# Patient Record
Sex: Female | Born: 1960 | Hispanic: No | Marital: Married | Smoking: Never smoker
Health system: Southern US, Community
[De-identification: ages and names within clinical notes are randomized; demographics above are authoritative.]

## PROBLEM LIST (undated history)

## (undated) DIAGNOSIS — I10 Essential (primary) hypertension: Secondary | ICD-10-CM

---

## 2017-07-21 ENCOUNTER — Emergency Department (HOSPITAL_COMMUNITY): Payer: 59

## 2017-07-21 ENCOUNTER — Other Ambulatory Visit: Payer: Self-pay

## 2017-07-21 ENCOUNTER — Ambulatory Visit (HOSPITAL_COMMUNITY)
Admission: EM | Admit: 2017-07-21 | Discharge: 2017-07-21 | Disposition: A | Discharge status: Other Acute Inpatient Hospital | Payer: Self-pay

## 2017-07-21 ENCOUNTER — Emergency Department (HOSPITAL_BASED_OUTPATIENT_CLINIC_OR_DEPARTMENT_OTHER): Payer: 59

## 2017-07-21 ENCOUNTER — Inpatient Hospital Stay (HOSPITAL_COMMUNITY)
Admission: EM | Admit: 2017-07-21 | Discharge: 2017-07-28 | DRG: 871 | Disposition: A | Discharge status: Home / Self Care | Payer: 59 | Attending: Internal Medicine | Admitting: Internal Medicine

## 2017-07-21 ENCOUNTER — Encounter (HOSPITAL_COMMUNITY): Payer: Self-pay | Admitting: *Deleted

## 2017-07-21 ENCOUNTER — Inpatient Hospital Stay (HOSPITAL_COMMUNITY): Payer: 59

## 2017-07-21 DIAGNOSIS — L03126 Acute lymphangitis of left lower limb: Secondary | ICD-10-CM | POA: Diagnosis present

## 2017-07-21 DIAGNOSIS — I10 Essential (primary) hypertension: Secondary | ICD-10-CM

## 2017-07-21 DIAGNOSIS — L03116 Cellulitis of left lower limb: Secondary | ICD-10-CM | POA: Diagnosis present

## 2017-07-21 DIAGNOSIS — E785 Hyperlipidemia, unspecified: Secondary | ICD-10-CM | POA: Diagnosis present

## 2017-07-21 DIAGNOSIS — L270 Generalized skin eruption due to drugs and medicaments taken internally: Secondary | ICD-10-CM | POA: Diagnosis not present

## 2017-07-21 DIAGNOSIS — B999 Unspecified infectious disease: Secondary | ICD-10-CM

## 2017-07-21 DIAGNOSIS — D649 Anemia, unspecified: Secondary | ICD-10-CM | POA: Diagnosis present

## 2017-07-21 DIAGNOSIS — Z880 Allergy status to penicillin: Secondary | ICD-10-CM | POA: Diagnosis not present

## 2017-07-21 DIAGNOSIS — G9341 Metabolic encephalopathy: Secondary | ICD-10-CM | POA: Diagnosis present

## 2017-07-21 DIAGNOSIS — E875 Hyperkalemia: Secondary | ICD-10-CM | POA: Diagnosis not present

## 2017-07-21 DIAGNOSIS — E871 Hypo-osmolality and hyponatremia: Secondary | ICD-10-CM | POA: Diagnosis present

## 2017-07-21 DIAGNOSIS — Z79899 Other long term (current) drug therapy: Secondary | ICD-10-CM

## 2017-07-21 DIAGNOSIS — M7989 Other specified soft tissue disorders: Secondary | ICD-10-CM

## 2017-07-21 DIAGNOSIS — A419 Sepsis, unspecified organism: Principal | ICD-10-CM

## 2017-07-21 DIAGNOSIS — E872 Acidosis: Secondary | ICD-10-CM | POA: Diagnosis present

## 2017-07-21 DIAGNOSIS — L299 Pruritus, unspecified: Secondary | ICD-10-CM | POA: Diagnosis present

## 2017-07-21 DIAGNOSIS — I872 Venous insufficiency (chronic) (peripheral): Secondary | ICD-10-CM | POA: Diagnosis present

## 2017-07-21 DIAGNOSIS — Z6841 Body Mass Index (BMI) 40.0 and over, adult: Secondary | ICD-10-CM

## 2017-07-21 DIAGNOSIS — G4725 Circadian rhythm sleep disorder, jet lag type: Secondary | ICD-10-CM | POA: Diagnosis present

## 2017-07-21 DIAGNOSIS — N179 Acute kidney failure, unspecified: Secondary | ICD-10-CM | POA: Diagnosis present

## 2017-07-21 DIAGNOSIS — Z91018 Allergy to other foods: Secondary | ICD-10-CM | POA: Diagnosis not present

## 2017-07-21 DIAGNOSIS — T360X5A Adverse effect of penicillins, initial encounter: Secondary | ICD-10-CM | POA: Diagnosis not present

## 2017-07-21 DIAGNOSIS — L0390 Cellulitis, unspecified: Secondary | ICD-10-CM | POA: Diagnosis present

## 2017-07-21 HISTORY — DX: Essential (primary) hypertension: I10

## 2017-07-21 LAB — COMPREHENSIVE METABOLIC PANEL
ALBUMIN: 2.4 g/dL — AB (ref 3.5–5.0)
ALT: 25 U/L (ref 14–54)
AST: 21 U/L (ref 15–41)
Alkaline Phosphatase: 154 U/L — ABNORMAL HIGH (ref 38–126)
Anion gap: 16 — ABNORMAL HIGH (ref 5–15)
BILIRUBIN TOTAL: 1.1 mg/dL (ref 0.3–1.2)
BUN: 82 mg/dL — AB (ref 6–20)
CHLORIDE: 95 mmol/L — AB (ref 101–111)
CO2: 13 mmol/L — AB (ref 22–32)
Calcium: 8.5 mg/dL — ABNORMAL LOW (ref 8.9–10.3)
Creatinine, Ser: 9.42 mg/dL — ABNORMAL HIGH (ref 0.44–1.00)
GFR calc Af Amer: 5 mL/min — ABNORMAL LOW (ref >60)
GFR calc non Af Amer: 4 mL/min — ABNORMAL LOW (ref >60)
GLUCOSE: 116 mg/dL — AB (ref 65–99)
POTASSIUM: 5 mmol/L (ref 3.5–5.1)
SODIUM: 124 mmol/L — AB (ref 135–145)
TOTAL PROTEIN: 7.4 g/dL (ref 6.5–8.1)

## 2017-07-21 LAB — CBC WITH DIFFERENTIAL/PLATELET
Basophils Absolute: 0.2 10*3/uL — ABNORMAL HIGH (ref 0.0–0.1)
Basophils Relative: 1 %
Eosinophils Absolute: 0.4 10*3/uL (ref 0.0–0.7)
Eosinophils Relative: 2 %
HCT: 32.9 % — ABNORMAL LOW (ref 36.0–46.0)
Hemoglobin: 10.5 g/dL — ABNORMAL LOW (ref 12.0–15.0)
Lymphocytes Relative: 8 %
Lymphs Abs: 1.8 10*3/uL (ref 0.7–4.0)
MCH: 26.9 pg (ref 26.0–34.0)
MCHC: 31.9 g/dL (ref 30.0–36.0)
MCV: 84.4 fL (ref 78.0–100.0)
Monocytes Absolute: 0.9 10*3/uL (ref 0.1–1.0)
Monocytes Relative: 4 %
Neutro Abs: 18.9 10*3/uL — ABNORMAL HIGH (ref 1.7–7.7)
Neutrophils Relative %: 85 %
Platelets: 328 10*3/uL (ref 150–400)
RBC: 3.9 MIL/uL (ref 3.87–5.11)
RDW: 16.1 % — ABNORMAL HIGH (ref 11.5–15.5)
WBC Morphology: INCREASED
WBC: 22.2 10*3/uL — ABNORMAL HIGH (ref 4.0–10.5)

## 2017-07-21 LAB — BASIC METABOLIC PANEL
Anion gap: 15 (ref 5–15)
BUN: 83 mg/dL — ABNORMAL HIGH (ref 6–20)
CO2: 15 mmol/L — ABNORMAL LOW (ref 22–32)
Calcium: 8.6 mg/dL — ABNORMAL LOW (ref 8.9–10.3)
Chloride: 94 mmol/L — ABNORMAL LOW (ref 101–111)
Creatinine, Ser: 9.67 mg/dL — ABNORMAL HIGH (ref 0.44–1.00)
GFR calc Af Amer: 5 mL/min — ABNORMAL LOW (ref >60)
GFR calc non Af Amer: 4 mL/min — ABNORMAL LOW (ref >60)
Glucose, Bld: 136 mg/dL — ABNORMAL HIGH (ref 65–99)
Potassium: 5.1 mmol/L (ref 3.5–5.1)
Sodium: 124 mmol/L — ABNORMAL LOW (ref 135–145)

## 2017-07-21 LAB — PROTIME-INR
INR: 1.14
Prothrombin Time: 14.5 seconds (ref 11.4–15.2)

## 2017-07-21 LAB — CK: CK TOTAL: 48 U/L (ref 38–234)

## 2017-07-21 LAB — I-STAT CG4 LACTIC ACID, ED: Lactic Acid, Venous: 1.1 mmol/L (ref 0.5–1.9)

## 2017-07-21 MED ORDER — ONDANSETRON HCL 4 MG/2ML IJ SOLN: 4.0000 mg | Freq: Four times a day (QID) | INTRAMUSCULAR | Status: DC | PRN

## 2017-07-21 MED ORDER — PIPERACILLIN-TAZOBACTAM 3.375 G IVPB 30 MIN
3.3750 g | Freq: Once | INTRAVENOUS | Status: AC
  Administered 2017-07-21: 3.375 g via INTRAVENOUS
  Filled 2017-07-21: qty 50

## 2017-07-21 MED ORDER — LACTATED RINGERS IV BOLUS
1000.0000 mL | Freq: Once | INTRAVENOUS | Status: AC
  Administered 2017-07-21: 1000 mL via INTRAVENOUS

## 2017-07-21 MED ORDER — PIPERACILLIN-TAZOBACTAM IN DEX 2-0.25 GM/50ML IV SOLN
2.2500 g | Freq: Three times a day (TID) | INTRAVENOUS | Status: DC
  Administered 2017-07-22 – 2017-07-24 (×7): 2.25 g via INTRAVENOUS
  Filled 2017-07-21 (×9): qty 50

## 2017-07-21 MED ORDER — ACETAMINOPHEN 650 MG RE SUPP: 650.0000 mg | Freq: Four times a day (QID) | RECTAL | Status: DC | PRN

## 2017-07-21 MED ORDER — VANCOMYCIN HCL IN DEXTROSE 1-5 GM/200ML-% IV SOLN: 1000.0000 mg | Freq: Once | INTRAVENOUS | Status: DC

## 2017-07-21 MED ORDER — HEPARIN SODIUM (PORCINE) 5000 UNIT/ML IJ SOLN
5000.0000 [IU] | Freq: Three times a day (TID) | INTRAMUSCULAR | Status: DC
  Administered 2017-07-22 – 2017-07-28 (×19): 5000 [IU] via SUBCUTANEOUS
  Filled 2017-07-21 (×19): qty 1

## 2017-07-21 MED ORDER — ONDANSETRON HCL 4 MG PO TABS: 4.0000 mg | ORAL_TABLET | Freq: Four times a day (QID) | ORAL | Status: DC | PRN

## 2017-07-21 MED ORDER — SODIUM CHLORIDE 0.9 % IV SOLN
INTRAVENOUS | Status: DC
  Administered 2017-07-22: via INTRAVENOUS

## 2017-07-21 MED ORDER — ACETAMINOPHEN 325 MG PO TABS: 650.0000 mg | ORAL_TABLET | Freq: Four times a day (QID) | ORAL | Status: DC | PRN

## 2017-07-21 MED ORDER — VANCOMYCIN HCL 10 G IV SOLR
1750.0000 mg | INTRAVENOUS | Status: AC
  Administered 2017-07-21: 1750 mg via INTRAVENOUS
  Filled 2017-07-21: qty 1750

## 2017-07-21 NOTE — H&P (Signed)
History and Physical    Aimee Wilson WJX:[id number] DOB: 02-19-1960 DOA: 07/21/2017  PCP: Patient, No Pcp Per  Patient coming from: Home  Chief Complaint: Left leg swelling  HPI: Aimee Wilson is a 57 y.o. female with medical history significant of hypertension just flew in from [place] yesterday morning to visit family.  Patient has no previous history of kidney disease that she knows of.  Her family members are here helping with interpretation.  They report that yesterday morning she arrived her leg was a little swollen on the left.  It was not red at that time.  She said it was itching a lot so she started scratching it and she made scratch marks to her leg.  Since then the leg has gotten more progressively more swollen and then over the last 15 hours or so has become extremely red and weeping fluid.  The redness has extended over the entire left lower leg and is streaking up to the left thigh now.  She has been very confused and altered.  She has had no nausea vomiting or diarrhea.  She is been eating and drinking okay.  Patient is found to have acute kidney injury with a creatinine over 9.  She is also found to have sepsis from left lower extremity cellulitis and referred for admission for such.   Review of Systems: As per HPI otherwise 10 point review of systems negative per family  Past Medical History:  Diagnosis Date  . Hypertension     History reviewed. No pertinent surgical history.   reports that she has never smoked. She has never used smokeless tobacco. Her alcohol and drug histories are not on file.  No Known Allergies  No family history on file.  No premature coronary artery disease  Prior to Admission medications   Medication Sig Start Date End Date Taking? Authorizing Provider  amitriptyline (ELAVIL) 10 MG tablet Take 10 mg by mouth as needed for sleep.   Yes [provider]  pregabalin (LYRICA) 50 MG capsule Take 50 mg by mouth as needed (pain).   Yes  [provider]  PRESCRIPTION MEDICATION Take 1 tablet by mouth daily. Combination drug: Torsemide-Spironolactone 5mg -50mg    Yes [provider]  promethazine (PHENERGAN) 25 MG tablet Take 25 mg by mouth every 6 (six) hours as needed for nausea or vomiting.   Yes [provider]  RABEprazole (ACIPHEX) 20 MG tablet Take 20 mg by mouth daily as needed (heartburn, acid reflux).   Yes [provider]  rosuvastatin (CRESTOR) 10 MG tablet Take 10 mg by mouth daily.   Yes [provider]    Physical Exam: Vitals:   07/21/17 2033 07/21/17 2045 07/21/17 2129 07/21/17 2130  BP: 97/64 119/68    Pulse: 88 86  83  Resp: (!) 24   20  Temp:   98 F (36.7 C)   TempSrc:   Oral   SpO2: 100% 100%  100%  Weight:      Height:          Constitutional: NAD, calm, comfortable but sleepy Vitals:   07/21/17 2033 07/21/17 2045 07/21/17 2129 07/21/17 2130  BP: 97/64 119/68    Pulse: 88 86  83  Resp: (!) 24   20  Temp:   98 F (36.7 C)   TempSrc:   Oral   SpO2: 100% 100%  100%  Weight:      Height:       Eyes: PERRL, lids and conjunctivae normal ENMT:  Mucous membranes are moist. Posterior pharynx clear of any exudate or lesions.Normal dentition.  Neck: normal, supple, no masses, no thyromegaly Respiratory: clear to auscultation bilaterally, no wheezing, no crackles. Normal respiratory effort. No accessory muscle use.  Cardiovascular: Regular rate and rhythm, no murmurs / rubs / gallops. No extremity edema. 2+ pedal pulses. No carotid bruits.  Abdomen: no tenderness, no masses palpated. No hepatosplenomegaly. Bowel sounds positive.  Musculoskeletal: no clubbing / cyanosis. No joint deformity upper and lower extremities. Good ROM, no contractures. Normal muscle tone.  Skin: no rashes, lesions, ulcers. No induration except left leg moderately swollen and erythematous up past knee with multiple blisters that have popped consistent with cellulitis  neurologic:  CN 2-12 grossly intact. Sensation intact, DTR normal. Strength 5/5 in all 4.  Psychiatric: Normal judgment and insight. Alert and oriented x 3. Normal mood.    Labs on Admission: I have personally reviewed following labs and imaging studies  CBC: Recent Labs  Lab 07/21/17 1649  WBC 22.2*  NEUTROABS 18.9*  HGB 10.5*  HCT 32.9*  MCV 84.4  PLT 328   Basic Metabolic Panel: Recent Labs  Lab 07/21/17 1649 07/21/17 2037  NA 124* 124*  K 5.1 5.0  CL 94* 95*  CO2 15* 13*  GLUCOSE 136* 116*  BUN 83* 82*  CREATININE 9.67* 9.42*  CALCIUM 8.6* 8.5*   GFR: Estimated Creatinine Clearance: 6.6 mL/min (A) (by C-G formula based on SCr of 9.42 mg/dL (H)). Liver Function Tests: Recent Labs  Lab 07/21/17 2037  AST 21  ALT 25  ALKPHOS 154*  BILITOT 1.1  PROT 7.4  ALBUMIN 2.4*   No results for input(s): LIPASE, AMYLASE in the last 168 hours. No results for input(s): AMMONIA in the last 168 hours. Coagulation Profile: Recent Labs  Lab 07/21/17 2037  INR 1.14   Cardiac Enzymes: Recent Labs  Lab 07/21/17 2037  CKTOTAL 48   BNP (last 3 results) No results for input(s): PROBNP in the last 8760 hours. HbA1C: No results for input(s): HGBA1C in the last 72 hours. CBG: No results for input(s): GLUCAP in the last 168 hours. Lipid Profile: No results for input(s): CHOL, HDL, LDLCALC, TRIG, CHOLHDL, LDLDIRECT in the last 72 hours. Thyroid Function Tests: No results for input(s): TSH, T4TOTAL, FREET4, T3FREE, THYROIDAB in the last 72 hours. Anemia Panel: No results for input(s): VITAMINB12, FOLATE, FERRITIN, TIBC, IRON, RETICCTPCT in the last 72 hours. Urine analysis: No results found for: COLORURINE, APPEARANCEUR, LABSPEC, PHURINE, GLUCOSEU, HGBUR, BILIRUBINUR, KETONESUR, PROTEINUR, UROBILINOGEN, NITRITE, LEUKOCYTESUR Sepsis Labs: !!!!!!!!!!!!!!!!!!!!!!!!!!!!!!!!!!!!!!!!!!!! @LABRCNTIP (procalcitonin:4,lacticidven:4) )No results found for this or any previous visit (from the  past 240 hour(s)).   Radiological Exams on Admission: Dg Chest Portable 1 View  Result Date: 07/21/2017 CLINICAL DATA:  Sepsis cellulitis infection EXAM: PORTABLE CHEST 1 VIEW COMPARISON:  None. FINDINGS: The heart size and mediastinal contours are within normal limits. Both lungs are clear. The visualized skeletal structures are unremarkable. IMPRESSION: No active disease. Electronically Signed   By: Jasmine Pang M.D.   On: 07/21/2017 21:30   Dg Tibia/fibula Left Port  Result Date: 07/21/2017 CLINICAL DATA:  Sepsis, cellulitis and infection EXAM: PORTABLE LEFT TIBIA AND FIBULA - 2 VIEW COMPARISON:  None. FINDINGS: Extensive edema within the soft tissues. No fracture or malalignment. No periostitis or bone destruction. No radiopaque foreign body in the soft tissues. IMPRESSION: Soft tissue edema.  No acute osseous abnormality Electronically Signed   By: Jasmine Pang M.D.   On: 07/21/2017 21:30   Dg Ankle Left  Port  Result Date: 07/21/2017 CLINICAL DATA:  Sepsis, cellulitis and infection EXAM: PORTABLE LEFT ANKLE - 2 VIEW COMPARISON:  None. FINDINGS: Extensive soft tissue edema. No soft tissue gas or radiopaque foreign body. No fracture or malalignment. No periostitis or bone destruction. Ankle mortise is symmetric. IMPRESSION: Large amount of soft tissue edema without acute osseous abnormality Electronically Signed   By: Jasmine PangKim  Fujinaga M.D.   On: 07/21/2017 21:28    Old chart reviewed Chest x-ray reviewed no edema or infiltrate Case discussed with EDP  Assessment/Plan 57 year old female with metabolic encephalopathy and sepsis from left lower extremity cellulitis with significant acute kidney injury  Principal Problem:   Sepsis (HCC)-secondary cellulitis which is moderate to severe.  There is no crepitus.  Placed on IV vancomycin and Zosyn.  Lactic acid is normal.  Placed in stepdown unit for close observation at least for the first 24 hours.  Follow-up on blood cultures.  UA pending.  Check  procalcitonin level.  Elevate leg.  Ultrasound negative for DVT.  Active Problems:   AKI (acute kidney injury) (HCC)-creatinine over 9.  Likely due to sepsis.  Given 2 L of IV fluids in the ED.  Will repeat BMP in the morning.  Placed on normal saline 125 mg/h after 2 L are given.  Adjust IV fluids after results of 5 AM drawl.  Check renal ultrasound.  She reports urinary output but cannot quantify.  Cellulitis-as above IV Vanco and Zosyn.  Elevate leg.    Acute metabolic encephalopathy-secondary to infection.  Should improve with treatment of infection.  Will observe in stepdown overnight.    Hypertension-hold all blood pressure medications at this time his blood pressures currently soft with systolics in the 90s.     DVT prophylaxis: SCD only on right leg due to blistering and open wounds to left leg on a heparin Code Status: Full Family Communication: Multiple Disposition Plan: Days Consults called: None Admission status: Admission   DAVID,RACHAL A MD [facility]  If 7PM-7AM, please contact night-coverage [email or web address] Password [email or web address]  07/21/2017, 10:45 PM

## 2017-07-21 NOTE — ED Provider Notes (Signed)
The patient is a very ill-appearing 57 year old female, she does not speak [place], she is from [place], she just got off an airplane yesterday, the family states that she is jet lagged and that is why she is sleepy.  That being said the patient presents with a very swollen red left lower extremity with some bulla formation, erythema, weeping and tenderness.  On my exam the patient is obtunded but able to be aroused through loud voice or painful stimuli, she is able to follow commands, her family is interpreting for her.  She has hypotension with a blood pressure of between 90 and 95 systolic, her leg is severely swollen erythematous warm to the touch but no crepitance is palpated.  There is some red streaking and apparent lymphangitis extending up the medial aspect of the leg.  I would consider that the patient likely has an infection given her white blood cell count of 22,000 and her creatinine of 9 which is new according to the family.  She is in acute kidney failure, she is critically ill with what appears to be a septic leg.  I would also consider DVT though at this time it appears to be more of an infectious etiology.  Will obtain an ultrasound, broad-spectrum antibiotics, plain films to rule out necrotizing fasciitis and consult with surgery as needed based on plain films and clinical picture.  She is not febrile, she will need to be admitted to the hospital.  She will require multiple services consulting, multiple interventions, critical care provided.  .Critical Care Performed by: Eber HongMiller, Taiya Nutting, MD Authorized by: Eber HongMiller, Shamel Galyean, MD   Critical care provider statement:    Critical care time (minutes):  35   Critical care time was exclusive of:  Separately billable procedures and treating other patients and teaching time   Critical care was necessary to treat or prevent imminent or life-threatening deterioration of the following conditions:  Renal failure, sepsis and shock   Critical care was  time spent personally by me on the following activities:  Blood draw for specimens, development of treatment plan with patient or surrogate, discussions with consultants, evaluation of patient's response to treatment, examination of patient, obtaining history from patient or surrogate, ordering and performing treatments and interventions, ordering and review of laboratory studies, ordering and review of radiographic studies, pulse oximetry, re-evaluation of patient's condition and review of old charts   I saw and evaluated the patient, reviewed the resident's note and I agree with the findings and plan.    Final diagnoses:  Infection  Infection      Eber HongMiller, Sharleen Szczesny, MD 07/22/17 2126

## 2017-07-21 NOTE — ED Provider Notes (Signed)
Patient placed in Quick Look pathway, seen and evaluated   Chief Complaint: leg swelling  HPI:   [place] with swelling in LLE; no swelling prior to travel. LEg has turned red with clear drainage. No fever, no history of DVT.   ROS: leg swelling (one)  Physical Exam:   Gen: No distress  Neuro: Awake and Alert  Skin: Warm    Focused Exam: LLE with sig edema- blistering and clear drainage   Initiation of care has begun. The patient has been counseled on the process, plan, and necessity for staying for the completion/evaluation, and the remainder of the medical screening examination    Aimee Wilson, Aimee Wilson, Aimee Wilson 07/21/17 1648    Aimee Wilson, Danielle, MD 07/22/17 [phone]

## 2017-07-21 NOTE — ED Provider Notes (Signed)
[facility] EMERGENCY DEPARTMENT Provider Note   CSN: [id number] Arrival date & time: 07/21/17  1550  History   Chief Complaint Chief Complaint  Patient presents with  . Leg Swelling    HPI Aimee Wilson is a 57 y.o. female.  The history is provided by the patient and a relative. The history is limited by the condition of the patient and a language barrier (patient speaks little [place], family is helping to interpret).    57 yo F with PMHx of HTN who presents with family with worsening severe left leg pain x 2 days. Is visiting family from [place], flew in 2 days ago. Noticed during her flight that she had mild pain and swelling to leg, family states this has worsened. Accompanied by decreased PO intake, fatigue, decreased and dark colored urine. Nothing relieves symptoms. Family denies fevers, N/V.   Past Medical History:  Diagnosis Date  . Hypertension     Patient Active Problem List   Diagnosis Date Noted  . Sepsis (HCC) 07/21/2017  . AKI (acute kidney injury) (HCC) 07/21/2017  . Cellulitis 07/21/2017  . Acute metabolic encephalopathy 07/21/2017  . Hypertension     History reviewed. No pertinent surgical history.   OB History   None      Home Medications    Prior to Admission medications   Medication Sig Start Date End Date Taking? Authorizing Provider  amitriptyline (ELAVIL) 10 MG tablet Take 10 mg by mouth as needed for sleep.   Yes [provider]  pregabalin (LYRICA) 50 MG capsule Take 50 mg by mouth as needed (pain).   Yes [provider]  PRESCRIPTION MEDICATION Take 1 tablet by mouth daily. Combination drug: Torsemide-Spironolactone 5mg -50mg    Yes [provider]  promethazine (PHENERGAN) 25 MG tablet Take 25 mg by mouth every 6 (six) hours as needed for nausea or vomiting.   Yes [provider]  RABEprazole (ACIPHEX) 20 MG tablet Take 20 mg by mouth daily as needed (heartburn, acid reflux).   Yes  [provider]  rosuvastatin (CRESTOR) 10 MG tablet Take 10 mg by mouth daily.   Yes [provider]    Family History No family history on file.  Social History Social History   Tobacco Use  . Smoking status: Never Smoker  . Smokeless tobacco: Never Used  Substance Use Topics  . Alcohol use: Not on file  . Drug use: Not on file     Allergies   Patient has no known allergies.   Review of Systems Review of Systems  Constitutional: Positive for appetite change and fatigue. Negative for chills and fever.  HENT: Negative for ear pain and sore throat.   Eyes: Negative for pain and visual disturbance.  Respiratory: Negative for cough and shortness of breath.   Cardiovascular: Positive for leg swelling. Negative for chest pain and palpitations.  Gastrointestinal: Negative for abdominal pain and vomiting.  Genitourinary: Positive for decreased urine volume.       Dark colored urine  Musculoskeletal: Positive for myalgias. Negative for arthralgias and back pain.  Skin: Positive for color change. Negative for rash.  Neurological: Negative for seizures and syncope.  All other systems reviewed and are negative.    Physical Exam Updated Vital Signs BP 113/69   Pulse 87   Temp 97.7 F (36.5 C)   Resp (!) 21   Ht 5\' 3"  (1.6 m)   Wt 101.7 kg (224 lb 3.3 oz)   SpO2 100%   BMI  39.72 kg/m   Physical Exam  Constitutional: She appears well-developed and well-nourished. No distress.  Ill appearing  HENT:  Head: Normocephalic and atraumatic.  Mouth/Throat: Mucous membranes are dry.  Eyes: Conjunctivae and EOM are normal.  Neck: Normal range of motion. Neck supple.  Cardiovascular: Normal rate, regular rhythm and intact distal pulses. Exam reveals no friction rub.  No murmur heard. Pulmonary/Chest: Effort normal and breath sounds normal. No stridor. No respiratory distress. She has no wheezes.  Abdominal: Soft. She exhibits no distension. There is no  tenderness. There is no guarding.  Obese  Musculoskeletal: She exhibits edema and tenderness.  LLE: edema, erythema, warmth, weeping to left lower leg to level of knee, intact DP pulse  Neurological:  Eyes open to verbal stimuli, oriented x 2.5, no focal deficits  Skin: Skin is warm. There is erythema.  See MSK  Nursing note and vitals reviewed.    ED Treatments / Results  Labs (all labs ordered are listed, but only abnormal results are displayed) Labs Reviewed  CBC WITH DIFFERENTIAL/PLATELET - Abnormal; Notable for the following components:      Result Value   WBC 22.2 (*)    Hemoglobin 10.5 (*)    HCT 32.9 (*)    RDW 16.1 (*)    Neutro Abs 18.9 (*)    Basophils Absolute 0.2 (*)    All other components within normal limits  BASIC METABOLIC PANEL - Abnormal; Notable for the following components:   Sodium 124 (*)    Chloride 94 (*)    CO2 15 (*)    Glucose, Bld 136 (*)    BUN 83 (*)    Creatinine, Ser 9.67 (*)    Calcium 8.6 (*)    GFR calc non Af Amer 4 (*)    GFR calc Af Amer 5 (*)    All other components within normal limits  COMPREHENSIVE METABOLIC PANEL - Abnormal; Notable for the following components:   Sodium 124 (*)    Chloride 95 (*)    CO2 13 (*)    Glucose, Bld 116 (*)    BUN 82 (*)    Creatinine, Ser 9.42 (*)    Calcium 8.5 (*)    Albumin 2.4 (*)    Alkaline Phosphatase 154 (*)    GFR calc non Af Amer 4 (*)    GFR calc Af Amer 5 (*)    Anion gap 16 (*)    All other components within normal limits  CULTURE, BLOOD (ROUTINE X 2)  CULTURE, BLOOD (ROUTINE X 2)  URINE CULTURE  MRSA PCR SCREENING  PROTIME-INR  CK  URINALYSIS, ROUTINE W REFLEX MICROSCOPIC  HIV ANTIBODY (ROUTINE TESTING)  BASIC METABOLIC PANEL  CBC  PROCALCITONIN  PROCALCITONIN  I-STAT CG4 LACTIC ACID, ED    EKG None  Radiology US Renal  Result Date: 07/21/2017 CLINICAL DATA:  Acute kidney injury today. Left lower extremity swelling. Hypertension and sepsis. EXAM: RENAL /  URINARY TRACT ULTRASOUND COMPLETE COMPARISON:  None. FINDINGS: Right Kidney: Length: 12.4 cm. Echogenicity within normal limits. No mass or hydronephrosis visualized. Left Kidney: Length: 12.6 cm. Echogenicity within normal limits. No mass or hydronephrosis visualized. Bladder: Appears normal for degree of bladder distention. IMPRESSION: Normal ultrasound appearance of the kidneys and bladder. No hydronephrosis. Electronically Signed   By: Burman Nieves M.D.   On: 07/21/2017 23:51   Dg Chest Portable 1 View  Result Date: 07/21/2017 CLINICAL DATA:  Sepsis cellulitis infection EXAM: PORTABLE CHEST 1 VIEW COMPARISON:  None. FINDINGS:  The heart size and mediastinal contours are within normal limits. Both lungs are clear. The visualized skeletal structures are unremarkable. IMPRESSION: No active disease. Electronically Signed   By: Jasmine PangKim  Fujinaga M.D.   On: 07/21/2017 21:30   Dg Tibia/fibula Left Port  Result Date: 07/21/2017 CLINICAL DATA:  Sepsis, cellulitis and infection EXAM: PORTABLE LEFT TIBIA AND FIBULA - 2 VIEW COMPARISON:  None. FINDINGS: Extensive edema within the soft tissues. No fracture or malalignment. No periostitis or bone destruction. No radiopaque foreign body in the soft tissues. IMPRESSION: Soft tissue edema.  No acute osseous abnormality Electronically Signed   By: Jasmine PangKim  Fujinaga M.D.   On: 07/21/2017 21:30   Dg Ankle Left Port  Result Date: 07/21/2017 CLINICAL DATA:  Sepsis, cellulitis and infection EXAM: PORTABLE LEFT ANKLE - 2 VIEW COMPARISON:  None. FINDINGS: Extensive soft tissue edema. No soft tissue gas or radiopaque foreign body. No fracture or malalignment. No periostitis or bone destruction. Ankle mortise is symmetric. IMPRESSION: Large amount of soft tissue edema without acute osseous abnormality Electronically Signed   By: Jasmine PangKim  Fujinaga M.D.   On: 07/21/2017 21:28    Procedures Procedures (including critical care time)  Medications Ordered in ED Medications    piperacillin-tazobactam (ZOSYN) IVPB 2.25 g (has no administration in time range)  heparin injection 5,000 Units (5,000 Units Subcutaneous Given 07/22/17 0051)  0.9 %  sodium chloride infusion ( Intravenous New Bag/Given 07/22/17 0028)  ondansetron (ZOFRAN) tablet 4 mg (has no administration in time range)    Or  ondansetron (ZOFRAN) injection 4 mg (has no administration in time range)  acetaminophen (TYLENOL) tablet 650 mg (has no administration in time range)    Or  acetaminophen (TYLENOL) suppository 650 mg (has no administration in time range)  lactated ringers bolus 1,000 mL (0 mLs Intravenous Stopped 07/22/17 0012)  piperacillin-tazobactam (ZOSYN) IVPB 3.375 g (0 g Intravenous Stopped 07/21/17 2335)  vancomycin (VANCOCIN) 1,750 mg in sodium chloride 0.9 % 500 mL IVPB (0 mg Intravenous Stopped 07/22/17 0012)  lactated ringers bolus 1,000 mL (0 mLs Intravenous Stopped 07/22/17 0012)     Initial Impression / Assessment and Plan / ED Course  I have reviewed the triage vital signs and the nursing notes.  Pertinent labs & imaging results that were available during my care of the patient were reviewed by me and considered in my medical decision making (see chart for details).     Aimee Wilson is a 57 y.o. female with PMHx of HTN who p/w LLE edema/erythema x 2 days. Reviewed and confirmed nursing documentation for past medical history, family history, social history. VS afebrile, BP 95/63. Exam remarkable for ill appearing women, fatigue but with no focal neuro deficits, LLE concerning for infection with edema. Ddx includes DVT, cellulitis, possible nec fasc.   BP improved with 2L LR. CMP with ARF, BUN 83, Cr 9.67, bicarb 15, hyponatremia 124. No priors for comparison, but no known kidney disease per family. EKG wnl. Making urine. No indication for emergent dialysis at this time. CK wnl. CBC with leukocytosis 22.2, normocytic anemia with hgb 10.5 (no comparison). Left tib/fib and ankle xrays with  no gas. CXR neg. LLE DVT u/s neg, though overall suboptimal study given extent of weeping of extremity. Treated with broad spectrum IV abx.   Old records reviewed. Labs reviewed by me and used in the medical decision making.  Imaging viewed and interpreted by me and used in the medical decision making (formal interpretation from radiologist). EKG reviewed by me and  used in the medical decision making. Admitted to hospitalist.    Final Clinical Impressions(s) / ED Diagnoses   Final diagnoses:  Acute renal failure  Right lower leg cellulitis     Diannia Ruder, MD 07/22/17 Helene Shoe    Eber Hong, MD 07/22/17 2126

## 2017-07-21 NOTE — Progress Notes (Signed)
Pharmacy Antibiotic Note  Alinda SierrasKanika Scobee is a 57 y.o. female admitted on 07/21/2017 with LLE swelling/redness with skin weeping. Pharmacy has been consulted for Vancomycin + Zosyn dosing for r/o sepsis.   SCr elevated at 9.67, CrCl<10 ml/min.   Plan: - Zosyn 3.375g IV x 1 followed by 2.25g IV every 8 hours -  Vancomycin 1750 mg IV x 1 to load -  No standing Vancomycin doses due to AKI - Will continue to follow renal function, culture results, LOT, and antibiotic de-escalation plans   Height: 4\' 10"  (147.3 cm) Weight: 209 lb 7 oz (95 kg) IBW/kg (Calculated) : 40.9  Temp (24hrs), Avg:98.8 F (37.1 C), Min:98.6 F (37 C), Max:99 F (37.2 C)  Recent Labs  Lab 07/21/17 1649  WBC 22.2*  CREATININE 9.67*    Estimated Creatinine Clearance: 6.4 mL/min (A) (by C-G formula based on SCr of 9.67 mg/dL (H)).    No Known Allergies  Antimicrobials this admission: Vanc 6/21 >> Zosyn 6/21 >>  Dose adjustments this admission: n/a  Microbiology results: 6/21 BCx >> 6/21 UCx >>  Thank you for allowing pharmacy to be a part of this patient's care.  Georgina PillionElizabeth Volney Reierson, PharmD, BCPS Clinical Pharmacist Pager: [phone] Clinical phone for 07/21/2017: [phone] 07/21/2017 8:55 PM

## 2017-07-21 NOTE — Progress Notes (Signed)
LLE venous duplex prelim: negative for DVT in visualized veins. Calf veins not well visualized due to significant weeping skin/ swelling. Farrel DemarkJill Eunice, RDMS, RVT

## 2017-07-21 NOTE — ED Triage Notes (Signed)
The pt has a swollen weeping lt lower leg  It started 2 days ago on a flight from [place]  She has a history of the same when she flies but this is worse

## 2017-07-22 LAB — BASIC METABOLIC PANEL
ANION GAP: 12 (ref 5–15)
BUN: 75 mg/dL — ABNORMAL HIGH (ref 6–20)
CALCIUM: 8.3 mg/dL — AB (ref 8.9–10.3)
CO2: 14 mmol/L — ABNORMAL LOW (ref 22–32)
Chloride: 100 mmol/L — ABNORMAL LOW (ref 101–111)
Creatinine, Ser: 7.71 mg/dL — ABNORMAL HIGH (ref 0.44–1.00)
GFR, EST AFRICAN AMERICAN: 6 mL/min — AB (ref >60)
GFR, EST NON AFRICAN AMERICAN: 5 mL/min — AB (ref >60)
Glucose, Bld: 89 mg/dL (ref 65–99)
Potassium: 5 mmol/L (ref 3.5–5.1)
Sodium: 126 mmol/L — ABNORMAL LOW (ref 135–145)

## 2017-07-22 LAB — URINALYSIS, ROUTINE W REFLEX MICROSCOPIC
Bilirubin Urine: NEGATIVE
Glucose, UA: NEGATIVE mg/dL
Ketones, ur: NEGATIVE mg/dL
Leukocytes, UA: NEGATIVE
NITRITE: NEGATIVE
Protein, ur: NEGATIVE mg/dL
SPECIFIC GRAVITY, URINE: 1.005 (ref 1.005–1.030)
pH: 5 (ref 5.0–8.0)

## 2017-07-22 LAB — CBC
HCT: 29.9 % — ABNORMAL LOW (ref 36.0–46.0)
HEMOGLOBIN: 10 g/dL — AB (ref 12.0–15.0)
MCH: 27.2 pg (ref 26.0–34.0)
MCHC: 33.4 g/dL (ref 30.0–36.0)
MCV: 81.3 fL (ref 78.0–100.0)
Platelets: 304 10*3/uL (ref 150–400)
RBC: 3.68 MIL/uL — ABNORMAL LOW (ref 3.87–5.11)
RDW: 15.9 % — ABNORMAL HIGH (ref 11.5–15.5)
WBC: 20.1 10*3/uL — ABNORMAL HIGH (ref 4.0–10.5)

## 2017-07-22 LAB — PROCALCITONIN
PROCALCITONIN: 1.23 ng/mL
Procalcitonin: 0.82 ng/mL

## 2017-07-22 LAB — HIV ANTIBODY (ROUTINE TESTING W REFLEX): HIV Screen 4th Generation wRfx: NONREACTIVE

## 2017-07-22 LAB — MRSA PCR SCREENING: MRSA by PCR: NEGATIVE

## 2017-07-22 MED ORDER — METOPROLOL TARTRATE 5 MG/5ML IV SOLN: 5.0000 mg | INTRAVENOUS | Status: DC | PRN

## 2017-07-22 MED ORDER — STERILE WATER FOR INJECTION IJ SOLN
INTRAVENOUS | Status: DC
  Administered 2017-07-22 – 2017-07-23 (×3): via INTRAVENOUS
  Filled 2017-07-22 (×4): qty 150

## 2017-07-22 NOTE — Progress Notes (Signed)
Attempted Foley catheter insertion under sterile conditions with second RN. Unable to pass catheter. MD notified. Pt and family educated about accurate I&O in the meantime.

## 2017-07-22 NOTE — Progress Notes (Addendum)
PROGRESS NOTE    Aimee Wilson  AVW:[id number] DOB: 06-10-1960 DOA: 07/21/2017 PCP: Patient, No Pcp Per   Brief Narrative: Patient is a 57 year old female with past medical history of hypertension who presented to the emergency department with complaints of altered mental status, productive progressive swelling and blistering of her left lower extremity patient was found to have severe acute kidney injury on presentation.  She does not have any history of CKD.Marland KitchenShe was admitted for the management of AKI and cellulitis of the left lower extremity.  Assessment & Plan:   Principal Problem:   Sepsis (HCC) Active Problems:   Hypertension   AKI (acute kidney injury) (HCC)   Cellulitis   Acute metabolic encephalopathy  Sepsis: Secondary to cellulitis of the left lower extremity.  Imaging of the left lower extremity did not show any clots or underlying abscess.  There was severe blistering with bulla formation and desquamation. Patient is afebrile, he her lactic acid and procalcitonin levels are normal.   Cellulitis  of the left lower extremity:  She was scratching her left lower extremity while on plane and the changes on the left lower extremity continue to worsen. This morning as per the family, the edema and blistering and weeping of the left lower extremity have improved.  Continue broad-spectrum antibiotics with vancomycin and Zosyn.  Will follow blood cultures.. Continue to elevate the legs.  Continue wound care.  Leukocytosis: Secondary to sepsis.  We will continue to monitor the trend.  Acute kidney injury: Presented with creatinine of more than 9 on presentation.  Most likely associated with sepsis.  Started on IV fluids.  Very poor urinary output but she has urinated 3 times since admission.  Foley was initially ordered but she wanted to take it out. I explained the importance of Foley catheter at this time.  We will reinsert the Foley catheter to accurately measure the urine  output. Ultrasound of the kidneys and bladder did not show any acute abnormalities.  I have consulted nephrology this morning.  Will check BMP that was sent this morning.  Metabolic acidosis: Secondary to acute kidney injury.  She will be started on bicarb.  Hyponatremia: We will follow-up sodium level on this morning BMP.  Altered mental status: Most likely secondary to acute metabolic encephalopathy secondary to infection.  Mental status has improved.  Currently she is in Jet lag as per the family .she just came to the [place] on June 19.  Hypertension: Currently her home antihypertensives have been held.  Blood pressure was soft on presentation.  Currently blood pressure stable.   DVT prophylaxis: Heparin [place] Code Status: Full Family Communication: Family members present at the bedside Disposition Plan: Likely home after resolution of cellulitis of the left lower extremity  Consultants: Nephrology  Procedures: None  Antimicrobials: Vancomycin and Zosyn  Subjective: Patient seen and examined the bedside this morning.  Was sleeping.  Opens her eyes and answer the questions when asked by his family.  Speaks only Bengali.  Objective: Vitals:   07/21/17 2300 07/21/17 2330 07/22/17 0025 07/22/17 0430  BP: 130/72 (!) 135/55 113/69 118/61  Pulse: 87   91  Resp: 18 (!) 21  17  Temp:   97.7 F (36.5 C)   TempSrc:      SpO2: 100%   100%  Weight:   101.7 kg (224 lb 3.3 oz)   Height:   4\' 11"  (1.499 m)     Intake/Output Summary (Last 24 hours) at 07/22/2017 8295 Last data filed  at 07/22/2017 0630 Gross per 24 hour  Intake 3251 ml  Output 550 ml  Net 2701 ml   Filed Weights   07/21/17 1628 07/22/17 0025  Weight: 95 kg (209 lb 7 oz) 101.7 kg (224 lb 3.3 oz)    Examination:  General exam: Not in distress,average built,sleeping HEENT:PERRL,Oral mucosa moist, Ear/Nose normal on gross exam Respiratory system: Bilateral equal air entry, normal vesicular breath sounds, no  wheezes or crackles  Cardiovascular system: S1 & S2 heard, RRR. No JVD, murmurs, rubs, gallops or clicks. No pedal edema. Gastrointestinal system: Abdomen is nondistended, soft and nontender. No organomegaly or masses felt. Normal bowel sounds heard. Central nervous system: Alert and oriented. No focal neurological deficits. Extremities: Desquamation, blistering, bullous formation, edema, erythema of the left lower extremity , ruptured bulla skin: no icterus ,no pallor MSK: Normal muscle bulk,tone ,power   Data Reviewed: I have personally reviewed following labs and imaging studies  CBC: Recent Labs  Lab 07/21/17 1649  WBC 22.2*  NEUTROABS 18.9*  HGB 10.5*  HCT 32.9*  MCV 84.4  PLT 328   Basic Metabolic Panel: Recent Labs  Lab 07/21/17 1649 07/21/17 2037  NA 124* 124*  K 5.1 5.0  CL 94* 95*  CO2 15* 13*  GLUCOSE 136* 116*  BUN 83* 82*  CREATININE 9.67* 9.42*  CALCIUM 8.6* 8.5*   GFR: Estimated Creatinine Clearance: 7 mL/min (A) (by C-G formula based on SCr of 9.42 mg/dL (H)). Liver Function Tests: Recent Labs  Lab 07/21/17 2037  AST 21  ALT 25  ALKPHOS 154*  BILITOT 1.1  PROT 7.4  ALBUMIN 2.4*   No results for input(s): LIPASE, AMYLASE in the last 168 hours. No results for input(s): AMMONIA in the last 168 hours. Coagulation Profile: Recent Labs  Lab 07/21/17 2037  INR 1.14   Cardiac Enzymes: Recent Labs  Lab 07/21/17 2037  CKTOTAL 48   BNP (last 3 results) No results for input(s): PROBNP in the last 8760 hours. HbA1C: No results for input(s): HGBA1C in the last 72 hours. CBG: No results for input(s): GLUCAP in the last 168 hours. Lipid Profile: No results for input(s): CHOL, HDL, LDLCALC, TRIG, CHOLHDL, LDLDIRECT in the last 72 hours. Thyroid Function Tests: No results for input(s): TSH, T4TOTAL, FREET4, T3FREE, THYROIDAB in the last 72 hours. Anemia Panel: No results for input(s): VITAMINB12, FOLATE, FERRITIN, TIBC, IRON, RETICCTPCT in the  last 72 hours. Sepsis Labs: Recent Labs  Lab 07/21/17 2119 07/21/17 2340  PROCALCITON  --  1.23  LATICACIDVEN 1.10  --     Recent Results (from the past 240 hour(s))  MRSA PCR Screening     Status: None   Collection Time: 07/22/17  1:06 AM  Result Value Ref Range Status   MRSA by PCR NEGATIVE NEGATIVE Final    Comment:        The GeneXpert MRSA Assay (FDA approved for NASAL specimens only), is one component of a comprehensive MRSA colonization surveillance program. It is not intended to diagnose MRSA infection nor to guide or monitor treatment for MRSA infections. Performed at [facility], [address] [place], [place] [address]          Radiology Studies: [place] Ren[facility]  Result Date: 07/21/2017 CLINICAL DATA:  Acute kidney injury today. Left lower extremity swelling. Hypertension and sepsis. EXAM: RENAL / URINARY TRACT ULTRASOUND COMPLETE COMPARISON:  None. FINDINGS: Right Kidney: Length: 12.4 cm. Echogenicity within normal limits. No mass or hydronephrosis visualized. Left Kidney: Length: 12.6 cm.  Echogenicity within normal limits. No mass or hydronephrosis visualized. Bladder: Appears normal for degree of bladder distention. IMPRESSION: Normal ultrasound appearance of the kidneys and bladder. No hydronephrosis. Electronically Signed   By: Burman Nieves M.D.   On: 07/21/2017 23:51   Dg Chest Portable 1 View  Result Date: 07/21/2017 CLINICAL DATA:  Sepsis cellulitis infection EXAM: PORTABLE CHEST 1 VIEW COMPARISON:  None. FINDINGS: The heart size and mediastinal contours are within normal limits. Both lungs are clear. The visualized skeletal structures are unremarkable. IMPRESSION: No active disease. Electronically Signed   By: Jasmine Pang M.D.   On: 07/21/2017 21:30   Dg Tibia/fibula Left Port  Result Date: 07/21/2017 CLINICAL DATA:  Sepsis, cellulitis and infection EXAM: PORTABLE LEFT TIBIA AND FIBULA - 2 VIEW COMPARISON:  None. FINDINGS: Extensive edema within  the soft tissues. No fracture or malalignment. No periostitis or bone destruction. No radiopaque foreign body in the soft tissues. IMPRESSION: Soft tissue edema.  No acute osseous abnormality Electronically Signed   By: Jasmine Pang M.D.   On: 07/21/2017 21:30   Dg Ankle Left Port  Result Date: 07/21/2017 CLINICAL DATA:  Sepsis, cellulitis and infection EXAM: PORTABLE LEFT ANKLE - 2 VIEW COMPARISON:  None. FINDINGS: Extensive soft tissue edema. No soft tissue gas or radiopaque foreign body. No fracture or malalignment. No periostitis or bone destruction. Ankle mortise is symmetric. IMPRESSION: Large amount of soft tissue edema without acute osseous abnormality Electronically Signed   By: Jasmine Pang M.D.   On: 07/21/2017 21:28        Scheduled Meds: . heparin  5,000 Units Subcutaneous Q8H   Continuous Infusions: . sodium chloride Stopped (07/22/17 0710)  . piperacillin-tazobactam (ZOSYN)  IV 2.25 g (07/22/17 0621)     LOS: 1 day    Time spent: 35 mins.More than 50% of that time was spent in counseling and/or coordination of care.      Burnadette Pop, MD [facility] Pager [phone]  If 7PM-7AM, please contact night-coverage [email or web address] Password [facility] 07/22/2017, 8:41 AM

## 2017-07-22 NOTE — Progress Notes (Signed)
Pt's right leg with large amount of serous weeping. Wound care consult in, ABD pads placed for now. Pt reports no pain at rest, but skin is taut, warm, and pink, and painful when touched. Pt elevating legs. Family at bedside.

## 2017-07-22 NOTE — Consult Note (Signed)
[facility]  HISTORY AND PHYSICAL  Aimee Wilson is an 57 y.o. female.    Chief Complaint: L LE swelling and blistering  HPI: Pt is a 77F with a PMH significant for HTN, HLD who is now seen in consultation at the request of Dr. Elbert Ewings for eval and recs re: severe presumed AKI.  History is aided by her niece and nephew who help interpret her first language (Bengali).    Pt arrived here 6/19 from [place].  On the flight here (a total of 33 hours in transit) she experienced increased LE edema.  She used a back scratcher to scratch and apparently broke some skin.  It started weeping and blistering.  Since 6/19, it's been getting worse.  Painful/ red/ swollen.  She's not been eating or drinking very well at all.    She came to the hospital 6/21 to get care.  Labs revealed a BUN of 83, Cr of 9.67, K 5.1, CO2 15, Na 124, WBC 22.2, Hgb 10.5.  Plts 328.  No prior history of CKD.  Cultures drawn, vanc/ Zosyn administered.  Xrays soft tissue edema, no gas.     She has had very poor PO intake for the past few days.  + f/c.  No n/v, SOB, CP.  She was urinating poorly but once she received IVF she's been urinating more per family.  PMH: Past Medical History:  Diagnosis Date  . Hypertension    PSH: History reviewed. No pertinent surgical history.   Past Medical History:  Diagnosis Date  . Hypertension     Medications:   Scheduled: . heparin  5,000 Units Subcutaneous Q8H    Medications Prior to Admission  Medication Sig Dispense Refill  . amitriptyline (ELAVIL) 10 MG tablet Take 10 mg by mouth as needed for sleep.    . pregabalin (LYRICA) 50 MG capsule Take 50 mg by mouth as needed (pain).    Marland Kitchen PRESCRIPTION MEDICATION Take 1 tablet by mouth daily. Combination drug: Torsemide-Spironolactone 72m-50mg    . promethazine (PHENERGAN) 25 MG tablet Take 25 mg by mouth every 6 (six) hours as needed for nausea or vomiting.    . RABEprazole (ACIPHEX) 20 MG tablet Take 20 mg by mouth  daily as needed (heartburn, acid reflux).    . rosuvastatin (CRESTOR) 10 MG tablet Take 10 mg by mouth daily.      ALLERGIES:  No Known Allergies  FAM HX: No family history on file.  Social History:   reports that she has never smoked. She has never used smokeless tobacco. Her alcohol and drug histories are not on file.  ROS: ROS: all other systems reviewed and are negative except as per HPI  Blood pressure 126/74, pulse (!) 101, temperature 97.7 F (36.5 C), resp. rate 18, height _0  (1.499 m), weight 101.7 kg (224 lb 3.3 oz), SpO2 99 %. PHYSICAL EXAM: Physical Exam  GEN sleeping, easily arousable, appears to feel ill HEENT EOMI PERRL MMM NECK no JVD PULM clear bilaterally CV RRR no m/r/g ABD obese, nontender, NABS EXT LLE with 3+ edema, erythema, weeping blisters up to the knee (per pt and family this is better).  I do not see any streaking redness NEURO AAO x 3 nonfocal SKIN no other rashes MSK no joint effusions    Results for orders placed or performed during the hospital encounter of 07/21/17 (from the past 48 hour(s))  CBC with Differential     Status: Abnormal   Collection Time: 07/21/17  4:49 PM  Result Value Ref Range   WBC 22.2 (H) 4.0 - 10.5 K/uL   RBC 3.90 3.87 - 5.11 MIL/uL   Hemoglobin 10.5 (L) 12.0 - 15.0 g/dL   HCT 32.9 (L) 36.0 - 46.0 %   MCV 84.4 78.0 - 100.0 fL   MCH 26.9 26.0 - 34.0 pg   MCHC 31.9 30.0 - 36.0 g/dL   RDW 16.1 (H) 11.5 - 15.5 %   Platelets 328 150 - 400 K/uL   Neutrophils Relative % 85 %   Lymphocytes Relative 8 %   Monocytes Relative 4 %   Eosinophils Relative 2 %   Basophils Relative 1 %   Neutro Abs 18.9 (H) 1.7 - 7.7 K/uL   Lymphs Abs 1.8 0.7 - 4.0 K/uL   Monocytes Absolute 0.9 0.1 - 1.0 K/uL   Eosinophils Absolute 0.4 0.0 - 0.7 K/uL   Basophils Absolute 0.2 (H) 0.0 - 0.1 K/uL   RBC Morphology BURR CELLS    WBC Morphology INCREASED BANDS (>20% BANDS)     Comment: MILD LEFT SHIFT (1-5% METAS, OCC MYELO, OCC  BANDS) Performed at [facility] [address], [place], [place] [address]   Basic metabolic panel     Status: Abnormal   Collection Time: 07/21/17  4:49 PM  Result Value Ref Range   Sodium 124 (L) 135 - 145 mmol/L   Potassium 5.1 3.5 - 5.1 mmol/L   Chloride 94 (L) 101 - 111 mmol/L   CO2 15 (L) 22 - 32 mmol/L   Glucose, Bld 136 (H) 65 - 99 mg/dL   BUN 83 (H) 6 - 20 mg/dL   Creatinine, Ser 9.67 (H) 0.44 - 1.00 mg/dL   Calcium 8.6 (L) 8.9 - 10.3 mg/dL   GFR calc non Af Amer 4 (L) >60 mL/min   GFR calc Af Amer 5 (L) >60 mL/min    Comment: (NOTE) The eGFR has been calculated using the CKD EPI equation. This calculation has not been validated in all clinical situations. eGFR's persistently <60 mL/min signify possible Chronic Kidney Disease.    Anion gap 15 5 - 15    Comment: Performed at [facility] [address], [place], [place] [address]  Comprehensive metabolic panel     Status: Abnormal   Collection Time: 07/21/17  8:37 PM  Result Value Ref Range   Sodium 124 (L) 135 - 145 mmol/L   Potassium 5.0 3.5 - 5.1 mmol/L   Chloride 95 (L) 101 - 111 mmol/L   CO2 13 (L) 22 - 32 mmol/L   Glucose, Bld 116 (H) 65 - 99 mg/dL   BUN 82 (H) 6 - 20 mg/dL   Creatinine, Ser 9.42 (H) 0.44 - 1.00 mg/dL   Calcium 8.5 (L) 8.9 - 10.3 mg/dL   Total Protein 7.4 6.5 - 8.1 g/dL   Albumin 2.4 (L) 3.5 - 5.0 g/dL   AST 21 15 - 41 U/L   ALT 25 14 - 54 U/L   Alkaline Phosphatase 154 (H) 38 - 126 U/L   Total Bilirubin 1.1 0.3 - 1.2 mg/dL   GFR calc non Af Amer 4 (L) >60 mL/min   GFR calc Af Amer 5 (L) >60 mL/min    Comment: (NOTE) The eGFR has been calculated using the CKD EPI equation. This calculation has not been validated in all clinical situations. eGFR's persistently <60 mL/min signify possible Chronic Kidney Disease.    Anion gap 16 (H) 5 - 15    Comment: Performed at [facility], [place] [address] [place], [place] [address]  Protime-INR     Status: None   Collection  Time: 07/21/17  8:37 PM  Result Value Ref Range   Prothrombin Time 14.5 11.4 - 15.2 seconds   INR 1.14     Comment: Performed at [facility] [address] [place], [place] [address]  CK     Status: None   Collection Time: 07/21/17  8:37 PM  Result Value Ref Range   Total CK 48 38 - 234 U/L    Comment: Performed at [facility], [place] [address], [place], [place] [address]  I-Stat CG4 Lactic Acid, ED  (not at  [facility])     Status: None   Collection Time: 07/21/17  9:19 PM  Result Value Ref Range   Lactic Acid, Venous 1.10 0.5 - 1.9 mmol/L  Procalcitonin - Baseline     Status: None   Collection Time: 07/21/17 11:40 PM  Result Value Ref Range   Procalcitonin 1.23 ng/mL    Comment:        Interpretation: PCT > 0.5 ng/mL and <= 2 ng/mL: Systemic infection (sepsis) is possible, but other conditions are known to elevate PCT as well. (NOTE)       Sepsis PCT Algorithm           Lower Respiratory Tract                                      Infection PCT Algorithm    ----------------------------     ----------------------------         PCT < 0.25 ng/mL                PCT < 0.10 ng/mL         Strongly encourage             Strongly discourage   discontinuation of antibiotics    initiation of antibiotics    ----------------------------     -----------------------------       PCT 0.25 - 0.50 ng/mL            PCT 0.10 - 0.25 ng/mL               OR       >80% decrease in PCT            Discourage initiation of                                            antibiotics      Encourage discontinuation           of antibiotics    ----------------------------     -----------------------------         PCT >= 0.50 ng/mL              PCT 0.26 - 0.50 ng/mL                AND       <80% decrease in PCT             Encourage initiation of  antibiotics       Encourage continuation           of antibiotics    ----------------------------      -----------------------------        PCT >= 0.50 ng/mL                  PCT > 0.50 ng/mL               AND         increase in PCT                  Strongly encourage                                      initiation of antibiotics    Strongly encourage escalation           of antibiotics                                     -----------------------------                                           PCT <= 0.25 ng/mL                                                 OR                                        > 80% decrease in PCT                                     Discontinue / Do not initiate                                             antibiotics Performed at [facility], [address] [place], [place] [address]   MRSA PCR Screening     Status: None   Collection Time: 07/22/17  1:06 AM  Result Value Ref Range   MRSA by PCR NEGATIVE NEGATIVE    Comment:        The GeneXpert MRSA Assay (FDA approved for NASAL specimens only), is one component of a comprehensive MRSA colonization surveillance program. It is not intended to diagnose MRSA infection nor to guide or monitor treatment for MRSA infections. Performed at [facility], [place] [address], [place], [place] [address]   Urinalysis, Routine w reflex microscopic     Status: Abnormal   Collection Time: 07/22/17  6:46 AM  Result Value Ref Range   Color, Urine YELLOW YELLOW   APPearance CLEAR CLEAR   Specific Gravity, Urine 1.005 1.005 - 1.030   pH 5.0 5.0 - 8.0   Glucose, UA NEGATIVE NEGATIVE mg/dL   Hgb urine dipstick SMALL (A)  NEGATIVE   Bilirubin Urine NEGATIVE NEGATIVE   Ketones, ur NEGATIVE NEGATIVE mg/dL   Protein, ur NEGATIVE NEGATIVE mg/dL   Nitrite NEGATIVE NEGATIVE   Leukocytes, UA NEGATIVE NEGATIVE   RBC / HPF 0-5 0 - 5 RBC/hpf   WBC, UA 0-5 0 - 5 WBC/hpf   Bacteria, UA RARE (A) NONE SEEN   Squamous Epithelial / LPF 0-5 0 - 5   Mucus PRESENT     Comment: Performed at [facility], [place] [address], [place], [place] [address]  Basic metabolic panel     Status: Abnormal   Collection Time: 07/22/17  8:01 AM  Result Value Ref Range   Sodium 126 (L) 135 - 145 mmol/L   Potassium 5.0 3.5 - 5.1 mmol/L   Chloride 100 (L) 101 - 111 mmol/L   CO2 14 (L) 22 - 32 mmol/L   Glucose, Bld 89 65 - 99 mg/dL   BUN 75 (H) 6 - 20 mg/dL   Creatinine, Ser 7.71 (H) 0.44 - 1.00 mg/dL   Calcium 8.3 (L) 8.9 - 10.3 mg/dL   GFR calc non Af Amer 5 (L) >60 mL/min   GFR calc Af Amer 6 (L) >60 mL/min    Comment: (NOTE) The eGFR has been calculated using the CKD EPI equation. This calculation has not been validated in all clinical situations. eGFR's persistently <60 mL/min signify possible Chronic Kidney Disease.    Anion gap 12 5 - 15    Comment: Performed at [facility] [address], [place], [place] [address]  CBC     Status: Abnormal   Collection Time: 07/22/17  8:01 AM  Result Value Ref Range   WBC 20.1 (H) 4.0 - 10.5 K/uL   RBC 3.68 (L) 3.87 - 5.11 MIL/uL   Hemoglobin 10.0 (L) 12.0 - 15.0 g/dL   HCT 29.9 (L) 36.0 - 46.0 %   MCV 81.3 78.0 - 100.0 fL   MCH 27.2 26.0 - 34.0 pg   MCHC 33.4 30.0 - 36.0 g/dL   RDW 15.9 (H) 11.5 - 15.5 %   Platelets 304 150 - 400 K/uL    Comment: Performed at [facility], [place] [address] [place], [place] [address]  Procalcitonin     Status: None   Collection Time: 07/22/17  8:01 AM  Result Value Ref Range   Procalcitonin 0.82 ng/mL    Comment:        Interpretation: PCT > 0.5 ng/mL and <= 2 ng/mL: Systemic infection (sepsis) is possible, but other conditions are known to elevate PCT as well. (NOTE)       Sepsis PCT Algorithm           Lower Respiratory Tract                                      Infection PCT Algorithm    ----------------------------     ----------------------------         PCT < 0.25 ng/mL                PCT < 0.10 ng/mL         Strongly encourage             Strongly discourage   discontinuation of antibiotics    initiation of  antibiotics    ----------------------------     -----------------------------       PCT 0.25 -  0.50 ng/mL            PCT 0.10 - 0.25 ng/mL               OR       >80% decrease in PCT            Discourage initiation of                                            antibiotics      Encourage discontinuation           of antibiotics    ----------------------------     -----------------------------         PCT >= 0.50 ng/mL              PCT 0.26 - 0.50 ng/mL                AND       <80% decrease in PCT             Encourage initiation of                                             antibiotics       Encourage continuation           of antibiotics    ----------------------------     -----------------------------        PCT >= 0.50 ng/mL                  PCT > 0.50 ng/mL               AND         increase in PCT                  Strongly encourage                                      initiation of antibiotics    Strongly encourage escalation           of antibiotics                                     -----------------------------                                           PCT <= 0.25 ng/mL                                                 OR                                        > 80% decrease in PCT  Discontinue / Do not initiate                                             antibiotics Performed at [facility], [place] [address] [place], [place] [address]     US Renal  Result Date: 07/21/2017 CLINICAL DATA:  Acute kidney injury today. Left lower extremity swelling. Hypertension and sepsis. EXAM: RENAL / URINARY TRACT ULTRASOUND COMPLETE COMPARISON:  None. FINDINGS: Right Kidney: Length: 12.4 cm. Echogenicity within normal limits. No mass or hydronephrosis visualized. Left Kidney: Length: 12.6 cm. Echogenicity within normal limits. No mass or hydronephrosis visualized. Bladder: Appears normal for degree of bladder distention. IMPRESSION: Normal  ultrasound appearance of the kidneys and bladder. No hydronephrosis. Electronically Signed   By: Lucienne Capers M.D.   On: 07/21/2017 23:51   Dg Chest Portable 1 View  Result Date: 07/21/2017 CLINICAL DATA:  Sepsis cellulitis infection EXAM: PORTABLE CHEST 1 VIEW COMPARISON:  None. FINDINGS: The heart size and mediastinal contours are within normal limits. Both lungs are clear. The visualized skeletal structures are unremarkable. IMPRESSION: No active disease. Electronically Signed   By: Donavan Foil M.D.   On: 07/21/2017 21:30   Dg Tibia/fibula Left Port  Result Date: 07/21/2017 CLINICAL DATA:  Sepsis, cellulitis and infection EXAM: PORTABLE LEFT TIBIA AND FIBULA - 2 VIEW COMPARISON:  None. FINDINGS: Extensive edema within the soft tissues. No fracture or malalignment. No periostitis or bone destruction. No radiopaque foreign body in the soft tissues. IMPRESSION: Soft tissue edema.  No acute osseous abnormality Electronically Signed   By: Donavan Foil M.D.   On: 07/21/2017 21:30   Dg Ankle Left Port  Result Date: 07/21/2017 CLINICAL DATA:  Sepsis, cellulitis and infection EXAM: PORTABLE LEFT ANKLE - 2 VIEW COMPARISON:  None. FINDINGS: Extensive soft tissue edema. No soft tissue gas or radiopaque foreign body. No fracture or malalignment. No periostitis or bone destruction. Ankle mortise is symmetric. IMPRESSION: Large amount of soft tissue edema without acute osseous abnormality Electronically Signed   By: Donavan Foil M.D.   On: 07/21/2017 21:28    Assessment/Plan  1.  Presumed Severe AKI: pt came in with Cr 9 --> 7.7 with IVFs.  Renal US without any abnormality.  Unclear baseline.  Likely hemodynamically mediated ischemic injury.  Fortunately still making urine.  UA bland- don't suspect GN so don't think serologic workup necessary.  Will send  UP/C. No need for dialysis or biopsy right now.  Continue IVFs.  2.  LLE Cellulitis: Vanc/ Zosyn, cultures pending.  No gas on the plain films-  doubt clostridium or nec fasc esp since getting better with the antibiotics.  Could consider clinda if a high suspicion.    3.  Hyponatremia: improving with IVFs  4.  Metabolic acidosis: on bicarb for IVFs- continue  5.  Dispo: pending improvement  Telicia Hodgkiss 07/22/2017, 3:45 PM

## 2017-07-22 NOTE — Progress Notes (Signed)
Patient arrived to the unit via bed from the emergency department.  Patient is alert and oriented x 4.  No complaints of pain.  Skin assessment complete.  Left lower extremity  red, and weeping with blisters.  Educated the family on how to reach the staff on the unit.  Will continue to monitor the patient and notify MD as needed.

## 2017-07-22 NOTE — Progress Notes (Signed)
Patient heart rate in the 150-160's repeatedly. Nonsustained.  Asymptotic.  Notified NP X. Blount on call.

## 2017-07-23 ENCOUNTER — Inpatient Hospital Stay (HOSPITAL_COMMUNITY): Payer: 59

## 2017-07-23 LAB — CBC
HEMATOCRIT: 28.5 % — AB (ref 36.0–46.0)
HEMOGLOBIN: 9.6 g/dL — AB (ref 12.0–15.0)
MCH: 27.1 pg (ref 26.0–34.0)
MCHC: 33.7 g/dL (ref 30.0–36.0)
MCV: 80.5 fL (ref 78.0–100.0)
Platelets: 349 10*3/uL (ref 150–400)
RBC: 3.54 MIL/uL — ABNORMAL LOW (ref 3.87–5.11)
RDW: 15.5 % (ref 11.5–15.5)
WBC: 21.5 10*3/uL — ABNORMAL HIGH (ref 4.0–10.5)

## 2017-07-23 LAB — RENAL FUNCTION PANEL
ANION GAP: 15 (ref 5–15)
Albumin: 1.9 g/dL — ABNORMAL LOW (ref 3.5–5.0)
BUN: 56 mg/dL — AB (ref 6–20)
CHLORIDE: 97 mmol/L — AB (ref 101–111)
CO2: 22 mmol/L (ref 22–32)
Calcium: 8.3 mg/dL — ABNORMAL LOW (ref 8.9–10.3)
Creatinine, Ser: 4.18 mg/dL — ABNORMAL HIGH (ref 0.44–1.00)
GFR calc Af Amer: 13 mL/min — ABNORMAL LOW (ref >60)
GFR calc non Af Amer: 11 mL/min — ABNORMAL LOW (ref >60)
GLUCOSE: 92 mg/dL (ref 65–99)
POTASSIUM: 4.4 mmol/L (ref 3.5–5.1)
Phosphorus: 5.1 mg/dL — ABNORMAL HIGH (ref 2.5–4.6)
Sodium: 134 mmol/L — ABNORMAL LOW (ref 135–145)

## 2017-07-23 LAB — PROCALCITONIN: PROCALCITONIN: 0.76 ng/mL

## 2017-07-23 LAB — VANCOMYCIN, RANDOM: Vancomycin Rm: 14

## 2017-07-23 MED ORDER — HYDROCORTISONE 2.5 % RE CREA
TOPICAL_CREAM | RECTAL | Status: DC | PRN
  Filled 2017-07-23: qty 28.35

## 2017-07-23 MED ORDER — MORPHINE SULFATE (PF) 2 MG/ML IV SOLN
2.0000 mg | INTRAVENOUS | Status: DC | PRN
  Administered 2017-07-24 – 2017-07-25 (×5): 2 mg via INTRAVENOUS
  Filled 2017-07-23 (×7): qty 1

## 2017-07-23 MED ORDER — WITCH HAZEL-GLYCERIN EX PADS
MEDICATED_PAD | CUTANEOUS | Status: DC | PRN
Start: 2017-07-23 — End: 2017-07-26
  Filled 2017-07-23 (×2): qty 100

## 2017-07-23 NOTE — Consult Note (Addendum)
WOC Nurse wound consult note Reason for Consult: Consult requested for left leg cellulitis.  Wound type: Generalized edema and erythremia to left leg, extends from foot to below knee.  There are patchy areas of clear fluid filled blisters with intact skin, and other areas have  Ruptured and are evolving into full thickness skin loss, red moist wound beds with large amt yellow drainage, no odor. Sons are at the bedside to assess wound appearance and discuss plan of care. Dressing procedure/placement/frequency: Bedside nurse; change left leg dressing Q day as follows:  Moisten Aquacel with NS to remove previous dressing.  Lightly scrub leg with moist guaze to assist with removal of loose skin.  Apply Aquacel over open wounds, and Xerform over blistered areas, then cover with ABD pads and kerlex, beginning just behind toes, to below knee. WOC team will plan to reassess location in a few days.  Cammie Mcgeeawn Jayke Caul MSN, RN, CWOCN, [place]letonWCN-AP, CNS [phone]

## 2017-07-23 NOTE — Progress Notes (Signed)
Assessed LLE: pedal pulse strong, extremity warm but not hot. +3 edema that is tight, cap refill brisk, skin is pink, red, with visible weeping and sloughing of skin. ABD pads removed and despite using sterile saline to loosen dry dressing, some skin came off. Open area to medial ankle approximately 10x933mm. Covered area with Telfa non-stick gauze with ABD pads to soak up drainage (which appears a little less than yesterday and is serous in character), loosely secured with paper tape on the pads.

## 2017-07-23 NOTE — Progress Notes (Signed)
Patient OTF, RN will update consult when pt back from MRI.

## 2017-07-23 NOTE — Progress Notes (Signed)
[facility] Progress Note    Assessment/ Plan:   1.  Presumed Severe AKI: Aimee Wilson came in with Cr 9 --> 7.7--> 4.2 with IVFs.  Renal US without any abnormality.  Unclear baseline but per family no h/o kidney problems.  Likely hemodynamically mediated ischemic injury.  Fortunately still making urine.  UA bland- don't suspect GN so don't think serologic workup necessary.  Expect AKI to resolve to baseline.  Stop IVFs.  We'll sign off now.  Doesn't need to follow unless there is residual renal insuffiency.     2.  LLE Cellulitis: Vanc/ Zosyn, cultures pending.  No gas on the plain films- doubt clostridium or nec fasc esp since getting better with the antibiotics.  Could consider clinda if a high suspicion.  MRI without contrast per primary.    3.  Hyponatremia: resolved, stop IVFs.  4.  Metabolic acidosis: resolved, stop IVFs  5.  Dispo: pending improvement    Subjective:    Cr much better, coming down, UOP increasing.  Aimee Wilson sitting up and eating breakfast.  Fam at bedside.   Objective:   BP 128/65   Pulse 91   Temp 98.8 F (37.1 C)   Resp 18   Ht 4\' 11"  (1.499 m)   Wt 101.7 kg (224 lb 3.3 oz)   SpO2 97%   BMI 45.28 kg/m   Intake/Output Summary (Last 24 hours) at 07/23/2017 1034 Last data filed at 07/23/2017 0900 Gross per 24 hour  Intake 807.24 ml  Output 2500 ml  Net -1692.76 ml   Weight change:   Physical Exam: GEN sitting up in bed eating oatmeal HEENT EOMI PERRL MMM NECK no JVD PULM clear bilaterally CV RRR no m/r/g ABD obese, nontender, NABS EXT LLE with 3+ edema, some weeping blisters up to the knee.  Erythema improving, doesn't appear as tight today NEURO AAO x 3 nonfocal SKIN no other rashes MSK no joint effusions   Imaging: US Renal  Result Date: 07/21/2017 CLINICAL DATA:  Acute kidney injury today. Left lower extremity swelling. Hypertension and sepsis. EXAM: RENAL / URINARY TRACT ULTRASOUND COMPLETE COMPARISON:  None. FINDINGS: Right  Kidney: Length: 12.4 cm. Echogenicity within normal limits. No mass or hydronephrosis visualized. Left Kidney: Length: 12.6 cm. Echogenicity within normal limits. No mass or hydronephrosis visualized. Bladder: Appears normal for degree of bladder distention. IMPRESSION: Normal ultrasound appearance of the kidneys and bladder. No hydronephrosis. Electronically Signed   By: Burman Nieves M.D.   On: 07/21/2017 23:51   Dg Chest Portable 1 View  Result Date: 07/21/2017 CLINICAL DATA:  Sepsis cellulitis infection EXAM: PORTABLE CHEST 1 VIEW COMPARISON:  None. FINDINGS: The heart size and mediastinal contours are within normal limits. Both lungs are clear. The visualized skeletal structures are unremarkable. IMPRESSION: No active disease. Electronically Signed   By: Jasmine Pang M.D.   On: 07/21/2017 21:30   Dg Tibia/fibula Left Port  Result Date: 07/21/2017 CLINICAL DATA:  Sepsis, cellulitis and infection EXAM: PORTABLE LEFT TIBIA AND FIBULA - 2 VIEW COMPARISON:  None. FINDINGS: Extensive edema within the soft tissues. No fracture or malalignment. No periostitis or bone destruction. No radiopaque foreign body in the soft tissues. IMPRESSION: Soft tissue edema.  No acute osseous abnormality Electronically Signed   By: Jasmine Pang M.D.   On: 07/21/2017 21:30   Dg Ankle Left Port  Result Date: 07/21/2017 CLINICAL DATA:  Sepsis, cellulitis and infection EXAM: PORTABLE LEFT ANKLE - 2 VIEW COMPARISON:  None. FINDINGS: Extensive soft tissue edema. No  soft tissue gas or radiopaque foreign body. No fracture or malalignment. No periostitis or bone destruction. Ankle mortise is symmetric. IMPRESSION: Large amount of soft tissue edema without acute osseous abnormality Electronically Signed   By: Jasmine PangKim  Fujinaga M.D.   On: 07/21/2017 21:28    Labs: BMET Recent Labs  Lab 07/21/17 1649 07/21/17 2037 07/22/17 0801 07/23/17 0649  NA 124* 124* 126* 134*  K 5.1 5.0 5.0 4.4  CL 94* 95* 100* 97*  CO2 15* 13* 14*  22  GLUCOSE 136* 116* 89 92  BUN 83* 82* 75* 56*  CREATININE 9.67* 9.42* 7.71* 4.18*  CALCIUM 8.6* 8.5* 8.3* 8.3*  PHOS  --   --   --  5.1*   CBC Recent Labs  Lab 07/21/17 1649 07/22/17 0801 07/23/17 0649  WBC 22.2* 20.1* 21.5*  NEUTROABS 18.9*  --   --   HGB 10.5* 10.0* 9.6*  HCT 32.9* 29.9* 28.5*  MCV 84.4 81.3 80.5  PLT 328 304 349    Medications:    . heparin  5,000 Units Subcutaneous Q8H      Bufford ButtnerElizabeth Sylwia Cuervo MD 07/23/2017, 10:34 AM

## 2017-07-23 NOTE — Progress Notes (Signed)
PROGRESS NOTE    Aimee Wilson  EAV:[id number] DOB: 07/19/1960 DOA: 07/21/2017 PCP: Patient, No Pcp Per   Brief Narrative: Patient is a 57 year old female with past medical history of hypertension who presented to the emergency department with complaints of altered mental status, productive progressive swelling and blistering of her left lower extremity patient was found to have severe acute kidney injury on presentation.  She does not have any history of CKD.Marland KitchenShe was admitted for the management of AKI and cellulitis of the left lower extremity.  Assessment & Plan:   Principal Problem:   Sepsis (HCC) Active Problems:   Hypertension   AKI (acute kidney injury) (HCC)   Cellulitis   Acute metabolic encephalopathy  Sepsis: Secondary to cellulitis of the left lower extremity.  Imaging of the left lower extremity did not show significant findings except for edema.  There was severe blistering with bulla formation and desquamation. Patient is afebrile, he her lactic acid and procalcitonin levels are normal.   Cellulitis  of the left lower extremity:  She was scratching her left lower extremity while on plane and the changes on the left lower extremity continued to worsen.  Presented with severe edema, erythema, tenderness, bulla formation, blistering and discoloration.  Overall improvement today.  Left lower extremity is less tender and edematous.  We will get MRI of the left lower extremity to rule out any underlying abscess/fasciitis/necrosis. Continue broad-spectrum antibiotics with vancomycin and Zosyn.  Will follow blood cultures. Continue to elevate the legs.  Continue wound care.  Leukocytosis: Still significant.  Secondary to sepsis.  We will continue to monitor the trend.  Acute kidney injury: Kidney function continues to improve.  Presented with creatinine of more than 9 on presentation.  Most likely associated with sepsis.  She was started on IV fluids. Ultrasound of the kidneys and  bladder did not show any acute abnormalities.  Nephrology following.  Nephrology discontinued fluids today.  Metabolic acidosis: Secondary to acute kidney injury. Much improved.  Hyponatremia: We will follow-up sodium level,improving.  Altered mental status: Resolved.Most likely secondary to acute metabolic encephalopathy secondary to infection.   Hypertension: Currently her home antihypertensives have been held.  Blood pressure was soft on presentation.  Currently blood pressure stable.   DVT prophylaxis: Heparin [place] Code Status: Full Family Communication: Family members present at the bedside Disposition Plan: Likely home after resolution of cellulitis of the left lower extremity.  Consultants: Nephrology  Procedures: None  Antimicrobials: Vancomycin and Zosyn  Subjective: Patient seen and examined the bedside this morning.  Appears comfortable.  Lower extremity cellulitis looks improved today with less edema.  Objective: Vitals:   07/23/17 0030 07/23/17 0050 07/23/17 0622 07/23/17 0822  BP:  126/73 128/65   Pulse:  82 91   Resp:  16 18   Temp: 97.9 F (36.6 C)   98.8 F (37.1 C)  TempSrc: Oral     SpO2:  100% 97%   Weight:      Height:        Intake/Output Summary (Last 24 hours) at 07/23/2017 1104 Last data filed at 07/23/2017 0900 Gross per 24 hour  Intake 807.24 ml  Output 2500 ml  Net -1692.76 ml   Filed Weights   07/21/17 1628 07/22/17 0025  Weight: 95 kg (209 lb 7 oz) 101.7 kg (224 lb 3.3 oz)    Examination:  General exam: Appears calm and comfortable ,Not in distress,average built HEENT:PERRL,Oral mucosa moist, Ear/Nose normal on gross exam Respiratory system: Bilateral equal air entry,  normal vesicular breath sounds, no wheezes or crackles  Cardiovascular system: S1 & S2 heard, RRR. No JVD, murmurs, rubs, gallops or clicks. Gastrointestinal system: Abdomen is nondistended, soft and nontender. No organomegaly or masses felt. Normal bowel sounds  heard. Central nervous system: Alert and oriented. No focal neurological deficits. Extremities: Desquamation, blistering, bullous formation, edema, erythema of the left lower extremity , ruptured bulla Skin: No rashes, lesions or ulcers,no icterus ,no pallor MSK: Normal muscle bulk,tone ,power Psychiatry: Judgement and insight appear normal. Mood & affect appropriate.     Data Reviewed: I have personally reviewed following labs and imaging studies  CBC: Recent Labs  Lab 07/21/17 1649 07/22/17 0801 07/23/17 0649  WBC 22.2* 20.1* 21.5*  NEUTROABS 18.9*  --   --   HGB 10.5* 10.0* 9.6*  HCT 32.9* 29.9* 28.5*  MCV 84.4 81.3 80.5  PLT 328 304 349   Basic Metabolic Panel: Recent Labs  Lab 07/21/17 1649 07/21/17 2037 07/22/17 0801 07/23/17 0649  NA 124* 124* 126* 134*  K 5.1 5.0 5.0 4.4  CL 94* 95* 100* 97*  CO2 15* 13* 14* 22  GLUCOSE 136* 116* 89 92  BUN 83* 82* 75* 56*  CREATININE 9.67* 9.42* 7.71* 4.18*  CALCIUM 8.6* 8.5* 8.3* 8.3*  PHOS  --   --   --  5.1*   GFR: Estimated Creatinine Clearance: 15.8 mL/min (A) (by C-G formula based on SCr of 4.18 mg/dL (H)). Liver Function Tests: Recent Labs  Lab 07/21/17 2037 07/23/17 0649  AST 21  --   ALT 25  --   ALKPHOS 154*  --   BILITOT 1.1  --   PROT 7.4  --   ALBUMIN 2.4* 1.9*   No results for input(s): LIPASE, AMYLASE in the last 168 hours. No results for input(s): AMMONIA in the last 168 hours. Coagulation Profile: Recent Labs  Lab 07/21/17 2037  INR 1.14   Cardiac Enzymes: Recent Labs  Lab 07/21/17 2037  CKTOTAL 48   BNP (last 3 results) No results for input(s): PROBNP in the last 8760 hours. HbA1C: No results for input(s): HGBA1C in the last 72 hours. CBG: No results for input(s): GLUCAP in the last 168 hours. Lipid Profile: No results for input(s): CHOL, HDL, LDLCALC, TRIG, CHOLHDL, LDLDIRECT in the last 72 hours. Thyroid Function Tests: No results for input(s): TSH, T4TOTAL, FREET4, T3FREE,  THYROIDAB in the last 72 hours. Anemia Panel: No results for input(s): VITAMINB12, FOLATE, FERRITIN, TIBC, IRON, RETICCTPCT in the last 72 hours. Sepsis Labs: Recent Labs  Lab 07/21/17 2119 07/21/17 2340 07/22/17 0801 07/23/17 0649  PROCALCITON  --  1.23 0.82 0.76  LATICACIDVEN 1.10  --   --   --     Recent Results (from the past 240 hour(s))  Blood Culture (routine x 2)     Status: None (Preliminary result)   Collection Time: 07/21/17  8:58 PM  Result Value Ref Range Status   Specimen Description BLOOD LEFT HAND  Final   Special Requests   Final    BOTTLES DRAWN AEROBIC AND ANAEROBIC Blood Culture results may not be optimal due to an inadequate volume of blood received in culture bottles   Culture   Final    NO GROWTH < 24 HOURS Performed at [facility], [address] [place], [place] [address]    Report Status PENDING  Incomplete  Blood Culture (routine x 2)     Status: None (Preliminary result)   Collection Time: 07/21/17  9:05 PM  Result Value Ref Range Status   Specimen Description BLOOD RIGHT ANTECUBITAL  Final   Special Requests   Final    BOTTLES DRAWN AEROBIC AND ANAEROBIC Blood Culture adequate volume   Culture   Final    NO GROWTH < 24 HOURS Performed at [facility], [address] [place], [place] [address]    Report Status PENDING  Incomplete  MRSA PCR Screening     Status: None   Collection Time: 07/22/17  1:06 AM  Result Value Ref Range Status   MRSA by PCR NEGATIVE NEGATIVE Final    Comment:        The GeneXpert MRSA Assay (FDA approved for NASAL specimens only), is one component of a comprehensive MRSA colonization surveillance program. It is not intended to diagnose MRSA infection nor to guide or monitor treatment for MRSA infections. Performed at [facility], [address], [place], [place] [address]          Radiology Studies: US Renal  Result Date: 07/21/2017 CLINICAL DATA:  Acute kidney injury today. Left  lower extremity swelling. Hypertension and sepsis. EXAM: RENAL / URINARY TRACT ULTRASOUND COMPLETE COMPARISON:  None. FINDINGS: Right Kidney: Length: 12.4 cm. Echogenicity within normal limits. No mass or hydronephrosis visualized. Left Kidney: Length: 12.6 cm. Echogenicity within normal limits. No mass or hydronephrosis visualized. Bladder: Appears normal for degree of bladder distention. IMPRESSION: Normal ultrasound appearance of the kidneys and bladder. No hydronephrosis. Electronically Signed   By: Burman Nieves M.D.   On: 07/21/2017 23:51   Dg Chest Portable 1 View  Result Date: 07/21/2017 CLINICAL DATA:  Sepsis cellulitis infection EXAM: PORTABLE CHEST 1 VIEW COMPARISON:  None. FINDINGS: The heart size and mediastinal contours are within normal limits. Both lungs are clear. The visualized skeletal structures are unremarkable. IMPRESSION: No active disease. Electronically Signed   By: Jasmine Pang M.D.   On: 07/21/2017 21:30   Dg Tibia/fibula Left Port  Result Date: 07/21/2017 CLINICAL DATA:  Sepsis, cellulitis and infection EXAM: PORTABLE LEFT TIBIA AND FIBULA - 2 VIEW COMPARISON:  None. FINDINGS: Extensive edema within the soft tissues. No fracture or malalignment. No periostitis or bone destruction. No radiopaque foreign body in the soft tissues. IMPRESSION: Soft tissue edema.  No acute osseous abnormality Electronically Signed   By: Jasmine Pang M.D.   On: 07/21/2017 21:30   Dg Ankle Left Port  Result Date: 07/21/2017 CLINICAL DATA:  Sepsis, cellulitis and infection EXAM: PORTABLE LEFT ANKLE - 2 VIEW COMPARISON:  None. FINDINGS: Extensive soft tissue edema. No soft tissue gas or radiopaque foreign body. No fracture or malalignment. No periostitis or bone destruction. Ankle mortise is symmetric. IMPRESSION: Large amount of soft tissue edema without acute osseous abnormality Electronically Signed   By: Jasmine Pang M.D.   On: 07/21/2017 21:28        Scheduled Meds: . heparin  5,000  Units Subcutaneous Q8H   Continuous Infusions: . piperacillin-tazobactam (ZOSYN)  IV 2.25 g (07/23/17 0612)     LOS: 2 days    Time spent: 25 mins.More than 50% of that time was spent in counseling and/or coordination of care.      Burnadette Pop, MD [facility] Pager [phone]  If 7PM-7AM, please contact night-coverage [email or web address] Password [facility] 07/23/2017, 11:04 AM

## 2017-07-24 ENCOUNTER — Encounter (HOSPITAL_COMMUNITY): Payer: Self-pay

## 2017-07-24 ENCOUNTER — Other Ambulatory Visit: Payer: Self-pay

## 2017-07-24 LAB — BASIC METABOLIC PANEL
ANION GAP: 10 (ref 5–15)
BUN: 38 mg/dL — ABNORMAL HIGH (ref 6–20)
CO2: 27 mmol/L (ref 22–32)
Calcium: 8.6 mg/dL — ABNORMAL LOW (ref 8.9–10.3)
Chloride: 100 mmol/L — ABNORMAL LOW (ref 101–111)
Creatinine, Ser: 2.76 mg/dL — ABNORMAL HIGH (ref 0.44–1.00)
GFR calc Af Amer: 21 mL/min — ABNORMAL LOW (ref >60)
GFR, EST NON AFRICAN AMERICAN: 18 mL/min — AB (ref >60)
GLUCOSE: 94 mg/dL (ref 65–99)
POTASSIUM: 5 mmol/L (ref 3.5–5.1)
Sodium: 137 mmol/L (ref 135–145)

## 2017-07-24 LAB — CBC WITH DIFFERENTIAL/PLATELET
BASOS PCT: 1 %
Basophils Absolute: 0.2 10*3/uL — ABNORMAL HIGH (ref 0.0–0.1)
EOS ABS: 0.4 10*3/uL (ref 0.0–0.7)
EOS PCT: 2 %
HEMATOCRIT: 30.3 % — AB (ref 36.0–46.0)
Hemoglobin: 9.9 g/dL — ABNORMAL LOW (ref 12.0–15.0)
LYMPHS PCT: 11 %
Lymphs Abs: 2.4 10*3/uL (ref 0.7–4.0)
MCH: 27 pg (ref 26.0–34.0)
MCHC: 32.7 g/dL (ref 30.0–36.0)
MCV: 82.6 fL (ref 78.0–100.0)
Monocytes Absolute: 1.7 10*3/uL — ABNORMAL HIGH (ref 0.1–1.0)
Monocytes Relative: 8 %
NEUTROS PCT: 78 %
Neutro Abs: 17.1 10*3/uL — ABNORMAL HIGH (ref 1.7–7.7)
Platelets: 394 10*3/uL (ref 150–400)
RBC: 3.67 MIL/uL — ABNORMAL LOW (ref 3.87–5.11)
RDW: 15.5 % (ref 11.5–15.5)
WBC: 21.8 10*3/uL — ABNORMAL HIGH (ref 4.0–10.5)

## 2017-07-24 MED ORDER — VANCOMYCIN HCL 10 G IV SOLR
1500.0000 mg | INTRAVENOUS | Status: DC
  Administered 2017-07-24: 1500 mg via INTRAVENOUS
  Filled 2017-07-24 (×2): qty 1500

## 2017-07-24 MED ORDER — PIPERACILLIN-TAZOBACTAM 3.375 G IVPB
3.3750 g | Freq: Three times a day (TID) | INTRAVENOUS | Status: DC
  Administered 2017-07-24 – 2017-07-26 (×6): 3.375 g via INTRAVENOUS
  Filled 2017-07-24 (×7): qty 50

## 2017-07-24 NOTE — Progress Notes (Signed)
ANTIBIOTIC CONSULT NOTE   Pharmacy Consult for Vanco/Zosyn Indication: cellulitis  Allergies  Allergen Reactions  . Beef-Derived Products     Patient Measurements: Height: 4\' 11"  (149.9 cm) Weight: 224 lb 3.3 oz (101.7 kg) IBW/kg (Calculated) : 43.2 Adjusted Body Weight:    Vital Signs: Temp: 98.3 F (36.8 C) (06/24 1112) Temp Source: Oral (06/24 1112) BP: 117/65 (06/24 1112) Pulse Rate: 95 (06/24 1112) Intake/Output from previous day: 06/23 0701 - 06/24 0700 In: 231.1 [I.V.:231.1] Out: -  Intake/Output from this shift: No intake/output data recorded.  Labs: Recent Labs    07/22/17 0801 07/23/17 0649 07/24/17 0600  WBC 20.1* 21.5* 21.8*  HGB 10.0* 9.6* 9.9*  PLT 304 349 394  CREATININE 7.71* 4.18* 2.76*   Estimated Creatinine Clearance: 23.9 mL/min (A) (by C-G formula based on SCr of 2.76 mg/dL (H)). Recent Labs    07/23/17 1259  VANCORANDOM 14     Microbiology:   Medical History: Past Medical History:  Diagnosis Date  . Hypertension    Assessment: ID: Abx for cellulitis and r/o sepsis. If remains in AKI - may benefit from switching over to Zyvox. Afebrile, WBC 21.8 up, Scr 2.76 way down. UOP not documented?  Vanc 6/21 >> Zosyn 6/21 >>  6/23 VR: 14  6/21 BCx >>NGTD  6/21 UCx >> 6/21 MRSA PCR: neg   Goal of Therapy:  Vancomycin trough level 10-15 mcg/ml  Plan:  - Vanc 1500mg  IV q 48h - Zosyn 3.375g IV q 8 hrs  Zavon Hyson S. Merilynn Finlandobertson, PharmD, BCPS Clinical Staff Pharmacist Pager [phone]  Misty Stanleyobertson, [place] Toppins Stillinger 07/24/2017,11:35 AM

## 2017-07-24 NOTE — Progress Notes (Signed)
PROGRESS NOTE    Aimee Wilson  NUU:[id number] DOB: 02-13-1960 DOA: 07/21/2017 PCP: Patient, No Pcp Per   Brief Narrative: Patient is a 57 year old female with past medical history of hypertension who presented to the emergency department with complaints of altered mental status, productive progressive swelling and blistering of her left lower extremity patient was found to have severe acute kidney injury on presentation.  She does not have any history of CKD.She was admitted for the management of AKI and cellulitis of the left lower extremity.  Assessment & Plan:   Principal Problem:   Sepsis (HCC) Active Problems:   Hypertension   AKI (acute kidney injury) (HCC)   Cellulitis   Acute metabolic encephalopathy  Sepsis: Secondary to cellulitis of the left lower extremity.  Imaging of the left lower extremity did not show significant findings except for cellultic changes.No findings for myofasciitis, pyomyositis, septic arthritis or osteomyelitis.  There was severe blistering with bulla formation and desquamation. Patient is afebrile, he her lactic acid and procalcitonin levels are normal.   Cellulitis  of the left lower extremity:  She was scratching her left lower extremity while on plane and the changes on the left lower extremity continued to worsen.  Presented with severe edema, erythema, tenderness, bulla formation, blistering and discoloration.  Overall improving.  Left lower extremity is less tender and edematous.  Continue broad-spectrum antibiotics with vancomycin and Zosyn.  Will follow blood cultures. Continue to elevate the legs.  Continue wound care.  She needs wound care on discharge. We might de-escalate the antibiotics to oral and discharge home tomorrow.  Leukocytosis: Still significant.   We will continue to monitor the trend.  Cultures no growth till date.  Acute kidney injury: Kidney function continues to improve.  Presented with creatinine of more than 9 on presentation.     She was started on IV fluids which has been stopped. Ultrasound of the kidneys and bladder did not show any acute abnormalities.  Nephrology was following.   Metabolic acidosis: Secondary to acute kidney injury. Improved.  Hyponatremia: Resolved  Altered mental status: Resolved.  Hypertension: Currently her home antihypertensives have been held.  Blood pressure was soft on presentation.  Currently blood pressure stable.   DVT prophylaxis: Heparin [place] Code Status: Full Family Communication: Family members present at the bedside Disposition Plan: Likely home after resolution of cellulitis of the left lower extremity.[place] tomorrow  Consultants: Nephrology  Procedures: None  Antimicrobials: Vancomycin and Zosyn  Subjective: Patient seen and examined the bedside this morning.  Appears comfortable.  Left lower extremity wrapped with dressings.   Objective: Vitals:   07/24/17 0422 07/24/17 0619 07/24/17 0800 07/24/17 1112  BP:  (!) 139/91 (!) 142/71 117/65  Pulse:  86 90 95  Resp:  18  20  Temp: 98.1 F (36.7 C)   98.3 F (36.8 C)  TempSrc: Oral   Oral  SpO2:  97% 93% 95%  Weight:      Height:       No intake or output data in the 24 hours ending 07/24/17 1257 Filed Weights   07/21/17 1628 07/22/17 0025  Weight: 95 kg (209 lb 7 oz) 101.7 kg (224 lb 3.3 oz)    Examination:  General exam: Appears calm and comfortable ,Not in distress,average built HEENT:PERRL,Oral mucosa moist, Ear/Nose normal on gross exam Respiratory system: Bilateral equal air entry, normal vesicular breath sounds, no wheezes or crackles  Cardiovascular system: S1 & S2 heard, RRR. No JVD, murmurs, rubs, gallops or clicks. Gastrointestinal  system: Abdomen is nondistended, soft and nontender. No organomegaly or masses felt. Normal bowel sounds heard. Central nervous system: Alert and oriented. No focal neurological deficits. Extremities: Left lower extremity wrapped with dressings. On  presentation:Desquamation, blistering, bullous formation, edema, erythema of the left lower extremity , ruptured bulla Skin: No rashes, lesions or ulcers,no icterus ,no pallor MSK: Normal muscle bulk,tone ,power Psychiatry: Judgement and insight appear normal. Mood & affect appropriate.       Data Reviewed: I have personally reviewed following labs and imaging studies  CBC: Recent Labs  Lab 07/21/17 1649 07/22/17 0801 07/23/17 0649 07/24/17 0600  WBC 22.2* 20.1* 21.5* 21.8*  NEUTROABS 18.9*  --   --  17.1*  HGB 10.5* 10.0* 9.6* 9.9*  HCT 32.9* 29.9* 28.5* 30.3*  MCV 84.4 81.3 80.5 82.6  PLT 328 304 349 394   Basic Metabolic Panel: Recent Labs  Lab 07/21/17 1649 07/21/17 2037 07/22/17 0801 07/23/17 0649 07/24/17 0600  NA 124* 124* 126* 134* 137  K 5.1 5.0 5.0 4.4 5.0  CL 94* 95* 100* 97* 100*  CO2 15* 13* 14* 22 27  GLUCOSE 136* 116* 89 92 94  BUN 83* 82* 75* 56* 38*  CREATININE 9.67* 9.42* 7.71* 4.18* 2.76*  CALCIUM 8.6* 8.5* 8.3* 8.3* 8.6*  PHOS  --   --   --  5.1*  --    GFR: Estimated Creatinine Clearance: 23.9 mL/min (A) (by C-G formula based on SCr of 2.76 mg/dL (H)). Liver Function Tests: Recent Labs  Lab 07/21/17 2037 07/23/17 0649  AST 21  --   ALT 25  --   ALKPHOS 154*  --   BILITOT 1.1  --   PROT 7.4  --   ALBUMIN 2.4* 1.9*   No results for input(s): LIPASE, AMYLASE in the last 168 hours. No results for input(s): AMMONIA in the last 168 hours. Coagulation Profile: Recent Labs  Lab 07/21/17 2037  INR 1.14   Cardiac Enzymes: Recent Labs  Lab 07/21/17 2037  CKTOTAL 48   BNP (last 3 results) No results for input(s): PROBNP in the last 8760 hours. HbA1C: No results for input(s): HGBA1C in the last 72 hours. CBG: No results for input(s): GLUCAP in the last 168 hours. Lipid Profile: No results for input(s): CHOL, HDL, LDLCALC, TRIG, CHOLHDL, LDLDIRECT in the last 72 hours. Thyroid Function Tests: No results for input(s): TSH, T4TOTAL,  FREET4, T3FREE, THYROIDAB in the last 72 hours. Anemia Panel: No results for input(s): VITAMINB12, FOLATE, FERRITIN, TIBC, IRON, RETICCTPCT in the last 72 hours. Sepsis Labs: Recent Labs  Lab 07/21/17 2119 07/21/17 2340 07/22/17 0801 07/23/17 0649  PROCALCITON  --  1.23 0.82 0.76  LATICACIDVEN 1.10  --   --   --     Recent Results (from the past 240 hour(s))  Blood Culture (routine x 2)     Status: None (Preliminary result)   Collection Time: 07/21/17  8:58 PM  Result Value Ref Range Status   Specimen Description BLOOD LEFT HAND  Final   Special Requests   Final    BOTTLES DRAWN AEROBIC AND ANAEROBIC Blood Culture results may not be optimal due to an inadequate volume of blood received in culture bottles   Culture   Final    NO GROWTH 2 DAYS Performed at [facility], [address] [place], [place] [address]    Report Status PENDING  Incomplete  Blood Culture (routine x 2)     Status: None (Preliminary result)   Collection  Time: 07/21/17  9:05 PM  Result Value Ref Range Status   Specimen Description BLOOD RIGHT ANTECUBITAL  Final   Special Requests   Final    BOTTLES DRAWN AEROBIC AND ANAEROBIC Blood Culture adequate volume   Culture   Final    NO GROWTH 2 DAYS Performed at [facility], [address] [place], [place] [address]    Report Status PENDING  Incomplete  MRSA PCR Screening     Status: None   Collection Time: 07/22/17  1:06 AM  Result Value Ref Range Status   MRSA by PCR NEGATIVE NEGATIVE Final    Comment:        The GeneXpert MRSA Assay (FDA approved for NASAL specimens only), is one component of a comprehensive MRSA colonization surveillance program. It is not intended to diagnose MRSA infection nor to guide or monitor treatment for MRSA infections. Performed at [facility], [address] [place], [place] [address]          Radiology Studies: Mr Tibia Fibula Left Wo Contrast  Result Date: 07/23/2017 CLINICAL DATA:   Left lower extremity pain and swelling. EXAM: MRI OF LOWER LEFT EXTREMITY WITHOUT CONTRAST TECHNIQUE: Multiplanar, multisequence MR imaging of the left lower extremity was performed. No intravenous contrast was administered. COMPARISON:  Radiograph 07/21/2017 FINDINGS: Extensive subcutaneous soft tissue swelling/edema/fluid and skin thickening and edema most consistent with diffuse cellulitis. There is more focal fluid like signal changes surrounding the deep fascia but no findings to suggest myofasciitis or pyomyositis. No discrete fluid collection to suggest a drainable soft tissue abscess. No MR findings to suggest septic arthritis or osteomyelitis. IMPRESSION: 1. Diffuse severe left lower extremity cellulitis without discrete drainable soft tissue abscess. 2. No findings for myofasciitis, pyomyositis, septic arthritis or osteomyelitis. Electronically Signed   By: Rudie MeyerP.  Gallerani M.D.   On: 07/23/2017 12:47        Scheduled Meds: . heparin  5,000 Units Subcutaneous Q8H   Continuous Infusions: . piperacillin-tazobactam (ZOSYN)  IV    . vancomycin       LOS: 3 days    Time spent: 25 mins.More than 50% of that time was spent in counseling and/or coordination of care.      Burnadette PopAmrit Zakeria Kulzer, MD [facility] Pager [phone]  If 7PM-7AM, please contact night-coverage [email or web address] Password [email or web address] 07/24/2017, 12:57 PM

## 2017-07-25 LAB — CBC WITH DIFFERENTIAL/PLATELET
BASOS PCT: 1 %
Basophils Absolute: 0.2 10*3/uL — ABNORMAL HIGH (ref 0.0–0.1)
EOS ABS: 0.4 10*3/uL (ref 0.0–0.7)
Eosinophils Relative: 2 %
HEMATOCRIT: 31.1 % — AB (ref 36.0–46.0)
Hemoglobin: 9.8 g/dL — ABNORMAL LOW (ref 12.0–15.0)
LYMPHS ABS: 2.6 10*3/uL (ref 0.7–4.0)
Lymphocytes Relative: 12 %
MCH: 26.7 pg (ref 26.0–34.0)
MCHC: 31.5 g/dL (ref 30.0–36.0)
MCV: 84.7 fL (ref 78.0–100.0)
MONO ABS: 1.5 10*3/uL — AB (ref 0.1–1.0)
Monocytes Relative: 7 %
Neutro Abs: 16.6 10*3/uL — ABNORMAL HIGH (ref 1.7–7.7)
Neutrophils Relative %: 78 %
PLATELETS: 394 10*3/uL (ref 150–400)
RBC: 3.67 MIL/uL — ABNORMAL LOW (ref 3.87–5.11)
RDW: 15.4 % (ref 11.5–15.5)
WBC: 21.3 10*3/uL — ABNORMAL HIGH (ref 4.0–10.5)

## 2017-07-25 LAB — BASIC METABOLIC PANEL
Anion gap: 8 (ref 5–15)
BUN: 21 mg/dL — ABNORMAL HIGH (ref 6–20)
CALCIUM: 8.8 mg/dL — AB (ref 8.9–10.3)
CO2: 26 mmol/L (ref 22–32)
Chloride: 104 mmol/L (ref 98–111)
Creatinine, Ser: 1.9 mg/dL — ABNORMAL HIGH (ref 0.44–1.00)
GFR calc Af Amer: 33 mL/min — ABNORMAL LOW (ref >60)
GFR, EST NON AFRICAN AMERICAN: 28 mL/min — AB (ref >60)
GLUCOSE: 96 mg/dL (ref 70–99)
POTASSIUM: 5.4 mmol/L — AB (ref 3.5–5.1)
Sodium: 138 mmol/L (ref 135–145)

## 2017-07-25 MED ORDER — DIPHENHYDRAMINE HCL 50 MG/ML IJ SOLN
25.0000 mg | Freq: Once | INTRAMUSCULAR | Status: AC
  Administered 2017-07-25: 25 mg via INTRAVENOUS
  Filled 2017-07-25: qty 1

## 2017-07-25 MED ORDER — POLYETHYLENE GLYCOL 3350 17 G PO PACK
17.0000 g | PACK | Freq: Every day | ORAL | Status: DC
  Administered 2017-07-25 – 2017-07-28 (×3): 17 g via ORAL
  Filled 2017-07-25 (×3): qty 1

## 2017-07-25 MED ORDER — MORPHINE SULFATE (PF) 4 MG/ML IV SOLN: 4.0000 mg | INTRAVENOUS | Status: DC | PRN

## 2017-07-25 MED ORDER — DIPHENHYDRAMINE HCL 25 MG PO CAPS
25.0000 mg | ORAL_CAPSULE | Freq: Four times a day (QID) | ORAL | Status: DC | PRN
  Administered 2017-07-25 – 2017-07-26 (×3): 25 mg via ORAL
  Filled 2017-07-25 (×3): qty 1

## 2017-07-25 MED ORDER — SODIUM POLYSTYRENE SULFONATE 15 GM/60ML PO SUSP
30.0000 g | Freq: Once | ORAL | Status: AC
  Administered 2017-07-25: 30 g via ORAL
  Filled 2017-07-25: qty 120

## 2017-07-25 MED ORDER — HYDROCORTISONE 1 % EX CREA
TOPICAL_CREAM | Freq: Four times a day (QID) | CUTANEOUS | Status: DC | PRN
  Administered 2017-07-25 – 2017-07-27 (×6): via TOPICAL
  Filled 2017-07-25 (×5): qty 28

## 2017-07-25 MED ORDER — OXYCODONE-ACETAMINOPHEN 7.5-325 MG PO TABS
1.0000 | ORAL_TABLET | Freq: Four times a day (QID) | ORAL | Status: DC | PRN
  Administered 2017-07-25 – 2017-07-26 (×2): 1 via ORAL
  Filled 2017-07-25 (×2): qty 1

## 2017-07-25 NOTE — Consult Note (Signed)
WOC Nurse wound follow up I spoke with the primary RN, [place], and learned she has already done the dressing change today.  I gave her my pager number and asked her to call me tomorrow before she changes the dressing.  Also, she said one of the patient's sons was in the room and stated it looks better to him. Helmut MusterSherry Catharine Kettlewell, RN, MSN, CWOCN, CNS-BC, pager [phone]

## 2017-07-25 NOTE — Progress Notes (Signed)
Informed Dr. Renford DillsAdhikari that patient has new hive like rash on arms and upper back. Per MD verbal order 25 mg IV benadryl once.

## 2017-07-25 NOTE — Progress Notes (Addendum)
PROGRESS NOTE    Sher Shampine  WGN:[id number] DOB: 1960/03/12 DOA: 07/21/2017 PCP: Patient, No Pcp Per   Brief Narrative: Patient is a 57 year old female ,Bengali speaking,with past medical history of hypertension who presented to the emergency department with complaints of altered mental status,  progressive swelling and blistering of her left lower extremity. Patient was found to have severe acute kidney injury on presentation.  She does not have any history of CKD.She was admitted for the management of AKI and cellulitis of the left lower extremity.  Assessment & Plan:   Principal Problem:   Sepsis (HCC) Active Problems:   Hypertension   AKI (acute kidney injury) (HCC)   Cellulitis   Acute metabolic encephalopathy  Suspected Sepsis: Secondary to cellulitis of the left lower extremity.  Imaging of the left lower extremity did not show significant findings except for cellultic changes.No findings for myofasciitis, pyomyositis, septic arthritis or osteomyelitis.  There was severe blistering with bulla formation and desquamation. Patient is afebrile,her lactic acid and procalcitonin levels are normal.   Cellulitis  of the left lower extremity:  She was scratching her left lower extremity while on plane and the changes on the left lower extremity continued to worsen.  Presented with severe edema, erythema, tenderness, bulla formation, blistering and discoloration.  Overall improving.  Left lower extremity is less tender and edematous now.  Continue broad-spectrum antibiotics .  Will follow blood cultures,NGTD. Continue to elevate the legs.  Continue wound care.  She needs wound care on discharge.Case manager aware and she is arranging that. We will de-escalate the antibiotics to oral on discharge to home .  Leukocytosis: Still significant.   We will continue to monitor the trend.  Cultures no growth till date.  Acute kidney injury: Kidney function continues to improve.  Presented with  creatinine of more than 9 on presentation.    She was started on IV fluids which has been stopped. Ultrasound of the kidneys and bladder did not show any acute abnormalities.  Nephrology was following.   Metabolic acidosis: Secondary to acute kidney injury. Improved.  Hyponatremia: Resolved  Altered mental status: Resolved.  Hypertension: Currently her home antihypertensives have been held.  Blood pressure was soft on presentation.  Currently blood pressure stable.  Hyperkalemia: Given a dose of Kayexalate today.Will check levels tomorrow  Difficulty in ambulation: I have requested physical therapy today   DVT prophylaxis: Heparin Carey Code Status: Full Family Communication: Son present at the bedside Disposition Plan: Likely home after IV antibiotics for 1-2 days more.  Lower leg cellulitis continues to improve and hope for more improvement in 1 to 2 days.Also waiting for physical therapy evaluation  Consultants: Nephrology  Procedures: None  Antimicrobials: Vancomycin and Zosyn since 6/21  Subjective: Patient seen and examined the bedside this morning.  Remains comfortable.  Lower extremity pain continues to get better.  Still having pain on ambulation.  Objective: Vitals:   07/24/17 1516 07/24/17 2134 07/25/17 0512 07/25/17 0933  BP: 115/69 (!) 111/54 138/72 134/65  Pulse: 84 86 81 82  Resp: 18 19 19 18   Temp: 98.5 F (36.9 C) 98.8 F (37.1 C) 98.7 F (37.1 C)   TempSrc: Oral Oral Oral   SpO2: 94% 96% 100% 98%  Weight:      Height:        Intake/Output Summary (Last 24 hours) at 07/25/2017 0942 Last data filed at 07/25/2017 0650 Gross per 24 hour  Intake 685.87 ml  Output 300 ml  Net 385.87 ml  Filed Weights   07/21/17 1628 07/22/17 0025  Weight: 95 kg (209 lb 7 oz) 101.7 kg (224 lb 3.3 oz)    Examination:  General exam: Appears calm and comfortable ,Not in distress,average built HEENT:PERRL,Oral mucosa moist, Ear/Nose normal on gross exam,herpes labialis  on angles of mouth Respiratory system: Bilateral equal air entry, normal vesicular breath sounds, no wheezes or crackles  Cardiovascular system: S1 & S2 heard, RRR. No JVD, murmurs, rubs, gallops or clicks. Gastrointestinal system: Abdomen is nondistended, soft and nontender. No organomegaly or masses felt. Normal bowel sounds heard. Central nervous system: Alert and oriented. No focal neurological deficits. Extremities: Left lower extremity wrapped with dressings.Desquamation, blistering, bullous formation, edema, erythema of the left lower extremity , ruptured bulla but all these changes have been significantly improving Skin: No icterus ,no pallor MSK: Normal muscle bulk,tone ,power Psychiatry: Judgement and insight appear normal. Mood & affect appropriate.        Data Reviewed: I have personally reviewed following labs and imaging studies  CBC: Recent Labs  Lab 07/21/17 1649 07/22/17 0801 07/23/17 0649 07/24/17 0600 07/25/17 0650  WBC 22.2* 20.1* 21.5* 21.8* 21.3*  NEUTROABS 18.9*  --   --  17.1* 16.6*  HGB 10.5* 10.0* 9.6* 9.9* 9.8*  HCT 32.9* 29.9* 28.5* 30.3* 31.1*  MCV 84.4 81.3 80.5 82.6 84.7  PLT 328 304 349 394 394   Basic Metabolic Panel: Recent Labs  Lab 07/21/17 2037 07/22/17 0801 07/23/17 0649 07/24/17 0600 07/25/17 0650  NA 124* 126* 134* 137 138  K 5.0 5.0 4.4 5.0 5.4*  CL 95* 100* 97* 100* 104  CO2 13* 14* 22 27 26   GLUCOSE 116* 89 92 94 96  BUN 82* 75* 56* 38* 21*  CREATININE 9.42* 7.71* 4.18* 2.76* 1.90*  CALCIUM 8.5* 8.3* 8.3* 8.6* 8.8*  PHOS  --   --  5.1*  --   --    GFR: Estimated Creatinine Clearance: 34.8 mL/min (A) (by C-G formula based on SCr of 1.9 mg/dL (H)). Liver Function Tests: Recent Labs  Lab 07/21/17 2037 07/23/17 0649  AST 21  --   ALT 25  --   ALKPHOS 154*  --   BILITOT 1.1  --   PROT 7.4  --   ALBUMIN 2.4* 1.9*   No results for input(s): LIPASE, AMYLASE in the last 168 hours. No results for input(s): AMMONIA in  the last 168 hours. Coagulation Profile: Recent Labs  Lab 07/21/17 2037  INR 1.14   Cardiac Enzymes: Recent Labs  Lab 07/21/17 2037  CKTOTAL 48   BNP (last 3 results) No results for input(s): PROBNP in the last 8760 hours. HbA1C: No results for input(s): HGBA1C in the last 72 hours. CBG: No results for input(s): GLUCAP in the last 168 hours. Lipid Profile: No results for input(s): CHOL, HDL, LDLCALC, TRIG, CHOLHDL, LDLDIRECT in the last 72 hours. Thyroid Function Tests: No results for input(s): TSH, T4TOTAL, FREET4, T3FREE, THYROIDAB in the last 72 hours. Anemia Panel: No results for input(s): VITAMINB12, FOLATE, FERRITIN, TIBC, IRON, RETICCTPCT in the last 72 hours. Sepsis Labs: Recent Labs  Lab 07/21/17 2119 07/21/17 2340 07/22/17 0801 07/23/17 0649  PROCALCITON  --  1.23 0.82 0.76  LATICACIDVEN 1.10  --   --   --     Recent Results (from the past 240 hour(s))  Blood Culture (routine x 2)     Status: None (Preliminary result)   Collection Time: 07/21/17  8:58 PM  Result Value Ref Range Status  Specimen Description BLOOD LEFT HAND  Final   Special Requests   Final    BOTTLES DRAWN AEROBIC AND ANAEROBIC Blood Culture results may not be optimal due to an inadequate volume of blood received in culture bottles   Culture   Final    NO GROWTH 3 DAYS Performed at [facility], [address] [place], [place] [address]    Report Status PENDING  Incomplete  Blood Culture (routine x 2)     Status: None (Preliminary result)   Collection Time: 07/21/17  9:05 PM  Result Value Ref Range Status   Specimen Description BLOOD RIGHT ANTECUBITAL  Final   Special Requests   Final    BOTTLES DRAWN AEROBIC AND ANAEROBIC Blood Culture adequate volume   Culture   Final    NO GROWTH 3 DAYS Performed at [facility], [address] [place], [place] [address]    Report Status PENDING  Incomplete  MRSA PCR Screening     Status: None   Collection Time: 07/22/17  1:06 AM    Result Value Ref Range Status   MRSA by PCR NEGATIVE NEGATIVE Final    Comment:        The GeneXpert MRSA Assay (FDA approved for NASAL specimens only), is one component of a comprehensive MRSA colonization surveillance program. It is not intended to diagnose MRSA infection nor to guide or monitor treatment for MRSA infections. Performed at [facility], [address] [place], [place] [address]          Radiology Studies: Mr Tibia Fibula Left Wo Contrast  Result Date: 07/23/2017 CLINICAL DATA:  Left lower extremity pain and swelling. EXAM: MRI OF LOWER LEFT EXTREMITY WITHOUT CONTRAST TECHNIQUE: Multiplanar, multisequence MR imaging of the left lower extremity was performed. No intravenous contrast was administered. COMPARISON:  Radiograph 07/21/2017 FINDINGS: Extensive subcutaneous soft tissue swelling/edema/fluid and skin thickening and edema most consistent with diffuse cellulitis. There is more focal fluid like signal changes surrounding the deep fascia but no findings to suggest myofasciitis or pyomyositis. No discrete fluid collection to suggest a drainable soft tissue abscess. No MR findings to suggest septic arthritis or osteomyelitis. IMPRESSION: 1. Diffuse severe left lower extremity cellulitis without discrete drainable soft tissue abscess. 2. No findings for myofasciitis, pyomyositis, septic arthritis or osteomyelitis. Electronically Signed   By: Rudie MeyerP.  Gallerani M.D.   On: 07/23/2017 12:47        Scheduled Meds: . heparin  5,000 Units Subcutaneous Q8H  . polyethylene glycol  17 g Oral Daily  . sodium polystyrene  30 g Oral Once   Continuous Infusions: . piperacillin-tazobactam (ZOSYN)  IV 3.375 g (07/25/17 0603)  . vancomycin Stopped (07/24/17 1605)     LOS: 4 days    Time spent: 25 mins.More than 50% of that time was spent in counseling and/or coordination of care.      Burnadette PopAmrit Tahji Madeira [place], MD [facility] Pager [phone]  If 7PM-7AM, please  contact night-coverage [email or web address] Password [email or web address] 07/25/2017, 9:42 AM

## 2017-07-26 DIAGNOSIS — E875 Hyperkalemia: Secondary | ICD-10-CM

## 2017-07-26 LAB — BASIC METABOLIC PANEL
ANION GAP: 11 (ref 5–15)
ANION GAP: 14 (ref 5–15)
BUN: 17 mg/dL (ref 6–20)
BUN: 17 mg/dL (ref 6–20)
CALCIUM: 8.4 mg/dL — AB (ref 8.9–10.3)
CO2: 24 mmol/L (ref 22–32)
CO2: 22 mmol/L (ref 22–32)
CREATININE: 1.87 mg/dL — AB (ref 0.44–1.00)
Calcium: 8.9 mg/dL (ref 8.9–10.3)
Chloride: 101 mmol/L (ref 98–111)
Chloride: 97 mmol/L — ABNORMAL LOW (ref 98–111)
Creatinine, Ser: 1.86 mg/dL — ABNORMAL HIGH (ref 0.44–1.00)
GFR calc Af Amer: 34 mL/min — ABNORMAL LOW (ref >60)
GFR calc non Af Amer: 29 mL/min — ABNORMAL LOW (ref >60)
GFR, EST AFRICAN AMERICAN: 34 mL/min — AB (ref >60)
GFR, EST NON AFRICAN AMERICAN: 29 mL/min — AB (ref >60)
GLUCOSE: 103 mg/dL — AB (ref 70–99)
Glucose, Bld: 199 mg/dL — ABNORMAL HIGH (ref 70–99)
POTASSIUM: 5.3 mmol/L — AB (ref 3.5–5.1)
Potassium: 4.9 mmol/L (ref 3.5–5.1)
SODIUM: 133 mmol/L — AB (ref 135–145)
Sodium: 136 mmol/L (ref 135–145)

## 2017-07-26 LAB — CBC WITH DIFFERENTIAL/PLATELET
BASOS ABS: 0 10*3/uL (ref 0.0–0.1)
Band Neutrophils: 9 %
Basophils Relative: 0 %
Blasts: 0 %
Eosinophils Absolute: 0.2 10*3/uL (ref 0.0–0.7)
Eosinophils Relative: 1 %
HEMATOCRIT: 35.7 % — AB (ref 36.0–46.0)
HEMOGLOBIN: 11.2 g/dL — AB (ref 12.0–15.0)
Lymphocytes Relative: 15 %
Lymphs Abs: 3.3 10*3/uL (ref 0.7–4.0)
MCH: 27.3 pg (ref 26.0–34.0)
MCHC: 31.4 g/dL (ref 30.0–36.0)
MCV: 86.9 fL (ref 78.0–100.0)
METAMYELOCYTES PCT: 3 %
MONO ABS: 2.2 10*3/uL — AB (ref 0.1–1.0)
Monocytes Relative: 10 %
Myelocytes: 1 %
NRBC: 0 /100{WBCs}
Neutro Abs: 16.4 10*3/uL — ABNORMAL HIGH (ref 1.7–7.7)
Neutrophils Relative %: 61 %
Other: 0 %
PROMYELOCYTES RELATIVE: 0 %
Platelets: 403 10*3/uL — ABNORMAL HIGH (ref 150–400)
RBC: 4.11 MIL/uL (ref 3.87–5.11)
RDW: 15.4 % (ref 11.5–15.5)
Smear Review: ADEQUATE
WBC: 22.1 10*3/uL — ABNORMAL HIGH (ref 4.0–10.5)

## 2017-07-26 LAB — CULTURE, BLOOD (ROUTINE X 2)
CULTURE: NO GROWTH
Culture: NO GROWTH
Special Requests: ADEQUATE

## 2017-07-26 MED ORDER — SODIUM POLYSTYRENE SULFONATE 15 GM/60ML PO SUSP
30.0000 g | Freq: Once | ORAL | Status: AC
  Administered 2017-07-26: 30 g via ORAL
  Filled 2017-07-26: qty 120

## 2017-07-26 MED ORDER — PREPARATION H TOTABLES WIPES 50 % EX PADS
1.0000 | MEDICATED_PAD | CUTANEOUS | Status: DC | PRN
  Filled 2017-07-26 (×3): qty 1

## 2017-07-26 MED ORDER — HYDROXYZINE HCL 25 MG PO TABS
25.0000 mg | ORAL_TABLET | Freq: Three times a day (TID) | ORAL | Status: DC | PRN
Start: 2017-07-26 — End: 2017-07-28
  Administered 2017-07-26: 25 mg via ORAL
  Filled 2017-07-26: qty 1

## 2017-07-26 MED ORDER — CEFAZOLIN SODIUM-DEXTROSE 2-4 GM/100ML-% IV SOLN
2.0000 g | Freq: Three times a day (TID) | INTRAVENOUS | Status: DC
Start: 2017-07-26 — End: 2017-07-28
  Administered 2017-07-26 – 2017-07-28 (×6): 2 g via INTRAVENOUS
  Filled 2017-07-26 (×7): qty 100

## 2017-07-26 NOTE — Consult Note (Signed)
WOC Nurse wound follow up Wound type:partial thickness and full thickness wounds. Cellulitis with edema, patient has new rash over trunkm neck and shoulders. MD made aware and antibiotics changed.  Measurement:entire left lower leg.  Wound YQM:VHQIbed:open wounds have red wound beds, and are drying up. Son states wounds much less wet than at prior dressing change. A lot of dried peeling skin present. Large area on dorsal surface of foot that was intact blister has now dried but skin is white and will most likely slough off to  present as a full thickness wound. Large intact boggy area on lateral side of calf less wet than yesterday but also will most likely peel to become a full thickness wound. Son pleased with how wounds are looking, asking appropriate questions. Drainage (amount, consistency, odor) small amount of yellow exudate. Periwound: the wound encompasses the entire left lower leg and foot. Dressing procedure/placement/frequency:Continue with current orders of cleansing with NS, patting dry, Aquacel Ag+ and Xeroform, ABD, kerlix. Please reconsult if WOC team needs to assist further.  Barnett HatterMelinda Aniket Paye, RN-C, WTA-C, OCA Wound Treatment Associate Ostomy Care Associate

## 2017-07-26 NOTE — Progress Notes (Signed)
On 07/25/17 pt had rash on chest back and arms.  This morning rash is more red on pt's back and present on abdomen. Benadryl given at 0239, hydrocortisone cream applied. No signs of acute distress. Craige CottaKirby, NP notified.

## 2017-07-26 NOTE — Progress Notes (Signed)
[facility] PROGRESS NOTE  Aimee Wilson ZOX:[id number] DOB: Sep 09, 1960 DOA: 07/21/2017  PCP: Patient, No Pcp Per  Brief History/Interval Summary: 57 year old female who is visiting family from the does not speak any English presented with altered mental status and swelling of her leg.  She flew in from by ambulation few days ago.  She was thought to have cellulitis.  She was admitted for further management.  She was also found to have acute kidney injury.  Reason for Visit: Left lower extremity cellulitis.  Consultants: None  Procedures: Left lower extremity venous Doppler was negative for DVT  Antibiotics: Was on vancomycin and Zosyn.  Both have been discontinued.  Ancef initiated.  Subjective/Interval History: Patient's son was in the room who was able to interpret.  Patient continues to have pain in the left leg.  Complains of feeling fatigued.  She has significant rash as well over her torso and back.  Itching a lot.  ROS: Denies any nausea or vomiting  Objective:  Vital Signs  Vitals:   07/25/17 1523 07/25/17 2215 07/26/17 0000 07/26/17 0641  BP: 140/71 115/68  (!) 118/47  Pulse: 79 79  94  Resp: 18 18  20   Temp: 98.6 F (37 C) 99.2 F (37.3 C) 98.9 F (37.2 C) 99.6 F (37.6 C)  TempSrc: Oral Oral Oral Oral  SpO2: 100% 96%  100%  Weight:      Height:        Intake/Output Summary (Last 24 hours) at 07/26/2017 1308 Last data filed at 07/25/2017 1700 Gross per 24 hour  Intake 120 ml  Output -  Net 120 ml   Filed Weights   07/21/17 1628 07/22/17 0025  Weight: 95 kg (209 lb 7 oz) 101.7 kg (224 lb 3.3 oz)    General appearance: alert, cooperative, appears stated age, fatigued and no distress Head: Normocephalic, without obvious abnormality, atraumatic Resp: clear to auscultation bilaterally Cardio: regular rate and rhythm, S1, S2 normal, no murmur, click, rub or gallop GI: soft, non-tender; bowel sounds normal; no masses,  no organomegaly Extremities:  Left lower extremity noted to be swollen.  Compared to the pictures that the patient's son showed me the swelling appears to have improved significantly.  Still erythematous.  Some blisters noted. Weeping. Good pulses peripherally. Skin: Maculopapular erythematous rash noted over the torso Neurologic: No focal deficits.  Lab Results:  Data Reviewed: I have personally reviewed following labs and imaging studies  CBC: Recent Labs  Lab 07/21/17 1649 07/22/17 0801 07/23/17 0649 07/24/17 0600 07/25/17 0650 07/26/17 0628  WBC 22.2* 20.1* 21.5* 21.8* 21.3* 22.1*  NEUTROABS 18.9*  --   --  17.1* 16.6* 16.4*  HGB 10.5* 10.0* 9.6* 9.9* 9.8* 11.2*  HCT 32.9* 29.9* 28.5* 30.3* 31.1* 35.7*  MCV 84.4 81.3 80.5 82.6 84.7 86.9  PLT 328 304 349 394 394 403*    Basic Metabolic Panel: Recent Labs  Lab 07/22/17 0801 07/23/17 0649 07/24/17 0600 07/25/17 0650 07/26/17 0628  NA 126* 134* 137 138 136  K 5.0 4.4 5.0 5.4* 5.3*  CL 100* 97* 100* 104 101  CO2 14* 22 27 26 24   GLUCOSE 89 92 94 96 103*  BUN 75* 56* 38* 21* 17  CREATININE 7.71* 4.18* 2.76* 1.90* 1.86*  CALCIUM 8.3* 8.3* 8.6* 8.8* 8.9  PHOS  --  5.1*  --   --   --     GFR: Estimated Creatinine Clearance: 35.5 mL/min (A) (by C-G formula based on SCr of 1.86 mg/dL (H)).  Liver Function Tests: Recent Labs  Lab 07/21/17 2037 07/23/17 0649  AST 21  --   ALT 25  --   ALKPHOS 154*  --   BILITOT 1.1  --   PROT 7.4  --   ALBUMIN 2.4* 1.9*     Coagulation Profile: Recent Labs  Lab 07/21/17 2037  INR 1.14    Cardiac Enzymes: Recent Labs  Lab 07/21/17 2037  CKTOTAL 48     Recent Results (from the past 240 hour(s))  Blood Culture (routine x 2)     Status: None (Preliminary result)   Collection Time: 07/21/17  8:58 PM  Result Value Ref Range Status   Specimen Description BLOOD LEFT HAND  Final   Special Requests   Final    BOTTLES DRAWN AEROBIC AND ANAEROBIC Blood Culture results may not be optimal due to an  inadequate volume of blood received in culture bottles   Culture   Final    NO GROWTH 4 DAYS Performed at [facility], [address] [place], [place] [address]    Report Status PENDING  Incomplete  Blood Culture (routine x 2)     Status: None (Preliminary result)   Collection Time: 07/21/17  9:05 PM  Result Value Ref Range Status   Specimen Description BLOOD RIGHT ANTECUBITAL  Final   Special Requests   Final    BOTTLES DRAWN AEROBIC AND ANAEROBIC Blood Culture adequate volume   Culture   Final    NO GROWTH 4 DAYS Performed at [facility], [address] [place], [place] [address]    Report Status PENDING  Incomplete  MRSA PCR Screening     Status: None   Collection Time: 07/22/17  1:06 AM  Result Value Ref Range Status   MRSA by PCR NEGATIVE NEGATIVE Final    Comment:        The GeneXpert MRSA Assay (FDA approved for NASAL specimens only), is one component of a comprehensive MRSA colonization surveillance program. It is not intended to diagnose MRSA infection nor to guide or monitor treatment for MRSA infections. Performed at [facility], [address], [place], [place] [address]       Radiology Studies: No results found.   Medications:  Scheduled: . heparin  5,000 Units Subcutaneous Q8H  . polyethylene glycol  17 g Oral Daily   Continuous: .  ceFAZolin (ANCEF) IV     BJY:[id number] **OR** acetaminophen, diphenhydrAMINE, hydrocortisone, hydrocortisone cream, metoprolol tartrate, morphine injection, ondansetron **OR** ondansetron (ZOFRAN) IV, oxyCODONE-acetaminophen, witch hazel-glycerin  Assessment/Plan:    Cellulitis of the left lower extremity suspected sepsis Patient recently traveled from [place] with the use.  She likely has chronic venous insufficiency exacerbated by the air travel and nonambulatory state at that time.  No DVT noted on venous Doppler study.  Patient was started on broad-spectrum antibiotics with vancomycin and  Zosyn.  Cellulitis appears to be improving.  However she did develop skin rash which appears to be drug reaction.  We will stop vancomycin and Zosyn and change to cefazolin for now.  MRSA PCR was negative.  Keep legs elevated.  Wound care.  MRI did not show any abscess.  Cultures negative so far.  Drug rash Inciting agent unknown but could be Zosyn or vancomycin.  More likely to be Zosyn.  Discontinued both.  Benadryl as needed.  Leukocytosis WBC still significantly high.  She is afebrile though.  Continue to watch for now.  Acute kidney injury with hyperkalemia and  hyponatremia and normal anion gap metabolic acidosis Renal function continues to improve.  Monitor urine output.  Potassium level still a little bit on the higher side.  Additional dose of Kayexalate ordered.  Recheck potassium later today.  Sodium level is normal.  Bicarbonate level has improved.  Acute metabolic encephalopathy Mild confusion likely due to metabolic derangements and acute infection.  Seems to have improved.  Essential hypertension Home antihypertensive agents were held.  Continue to monitor blood pressures closely.  Normocytic anemia Monitor hemoglobin.  No evidence of overt bleeding.  DVT Prophylaxis: Subcutaneous heparin    Code Status: Full code Family Communication: Discussed with the patient's son Disposition Plan: Management as outlined above.  PT evaluation.  Await further improvement.    LOS: 5 days   Osvaldo ShipperGokul Teofil Maniaci  [facility] Pager [phone] 07/26/2017, 1:08 PM  If 7PM-7AM, please contact night-coverage at [email or web address], password [email or web address]

## 2017-07-26 NOTE — Evaluation (Signed)
Physical Therapy Evaluation Patient Details Name: Aimee Wilson MRN: [id number] DOB: 09-05-1960 Today's Date: 07/26/2017   History of Present Illness  Patient is a 57 y/o female presenting with L LE swelling - progressive worseening due to scratching. Admitted with sepsis secondary to cellulitis with meatbolic encephalopathy and AKI due to sepsis. Patient is from [place]. PMH significant for HTN.  Clinical Impression  Aimee Wilson doing well this afternoon - family at beside to provide PLOF/history as patient is from [place]. Prior to admission, patient lived at home with her husband and was independent with all aspects of mobility without use of AD. Patient today requiring Min A for mobility and transfers with limited tolerance to standing due to pain at L LE. Attempted side stepping at bedside with patient able to take ~3 steps before needing rest due to pain. Patient to benefit from skilled PT to address mobility, strength, and safety for safe transition to d/c venue. PT to continue to follow acutely.    Follow Up Recommendations Home health PT;Supervision - Intermittent    Equipment Recommendations  Rolling walker with 5" wheels    Recommendations for Other Services       Precautions / Restrictions Precautions Precautions: Fall Restrictions Weight Bearing Restrictions: No      Mobility  Bed Mobility Overal bed mobility: Needs Assistance Bed Mobility: Supine to Sit;Sit to Supine     Supine to sit: Min assist Sit to supine: Min guard   General bed mobility comments: for LE management and safety  Transfers Overall transfer level: Needs assistance Equipment used: 1 person hand held assist Transfers: Sit to/from Stand Sit to Stand: Min assist         General transfer comment: Min A with reduced weight bearing; only tolerable to static stance < 30 seconds due to pain  Ambulation/Gait             General Gait Details: able to side step at bedside with 1 HHA;  intolerable to all other gait activity; some imbalance noted with side stepping; likely to need RW for mobility  Stairs            Wheelchair Mobility    Modified Rankin (Stroke Patients Only)       Balance Overall balance assessment: Needs assistance Sitting-balance support: No upper extremity supported;Feet supported Sitting balance-Leahy Scale: Fair     Standing balance support: Bilateral upper extremity supported;During functional activity Standing balance-Leahy Scale: Poor                               Pertinent Vitals/Pain Pain Assessment: Faces Faces Pain Scale: Hurts even more Pain Location: L LE with weight bearing Pain Descriptors / Indicators: Aching;Discomfort;Grimacing;Moaning;Guarding Pain Intervention(s): Limited activity within patient's tolerance;Monitored during session;Repositioned    Home Living Family/patient expects to be discharged to:: Private residence Living Arrangements: Spouse/significant other;Children Available Help at Discharge: Family Type of Home: House Home Access: Stairs to enter Entrance Stairs-Rails: Left Entrance Stairs-Number of Steps: 16 Home Layout: One level Home Equipment: None Additional Comments: patient is from [place] and is supposed to return home in a few weeks    Prior Function Level of Independence: Independent               Hand Dominance        Extremity/Trunk Assessment        Lower Extremity Assessment Lower Extremity Assessment: Generalized weakness;LLE deficits/detail LLE Deficits / Details: reduced weightbearing through this  extremity LLE: Unable to fully assess due to pain       Communication   Communication: Prefers language other than English(daughter interpreted for patient)  Cognition Arousal/Alertness: Awake/alert Behavior During Therapy: WFL for tasks assessed/performed Overall Cognitive Status: Within Functional Limits for tasks assessed                                         General Comments      Exercises     Assessment/Plan    PT Assessment Patient needs continued PT services  PT Problem List Decreased strength;Decreased activity tolerance;Decreased mobility;Decreased balance;Decreased knowledge of use of DME;Decreased safety awareness;Decreased knowledge of precautions;Pain       PT Treatment Interventions DME instruction;Gait training;Stair training;Functional mobility training;Therapeutic activities;Therapeutic exercise;Balance training;Neuromuscular re-education;Cognitive remediation;Patient/family education    PT Goals (Current goals can be found in the Care Plan section)  Acute Rehab PT Goals Patient Stated Goal: none stated PT Goal Formulation: With patient Time For Goal Achievement: 08/09/17 Potential to Achieve Goals: Good    Frequency Min 3X/week   Barriers to discharge        Co-evaluation               AM-PAC PT "6 Clicks" Daily Activity  Outcome Measure Difficulty turning over in bed (including adjusting bedclothes, sheets and blankets)?: A Little Difficulty moving from lying on back to sitting on the side of the bed? : Unable Difficulty sitting down on and standing up from a chair with arms (e.g., wheelchair, bedside commode, etc,.)?: Unable Help needed moving to and from a bed to chair (including a wheelchair)?: A Little Help needed walking in hospital room?: A Lot Help needed climbing 3-5 steps with a railing? : Total 6 Click Score: 11    End of Session Equipment Utilized During Treatment: Gait belt Activity Tolerance: Patient limited by pain Patient left: in bed;with call bell/phone within reach;with family/visitor present;with nursing/sitter in room Nurse Communication: Mobility status PT Visit Diagnosis: Unsteadiness on feet (R26.81);Other abnormalities of gait and mobility (R26.89);Muscle weakness (generalized) (M62.81)    Time: [id number] PT Time Calculation (min) (ACUTE ONLY):  21 min   Charges:   PT Evaluation $PT Eval Moderate Complexity: 1 Mod     PT G Codes:        Kipp LaurenceStephanie R Vahe Pienta, PT, DPT 07/26/17 2:57 PM

## 2017-07-26 NOTE — Progress Notes (Signed)
ANTIBIOTIC CONSULT NOTE - INITIAL  Pharmacy Consult for Ancef Indication: Cellulits  Allergies  Allergen Reactions  . Beef-Derived Products   . Zosyn [Piperacillin Sod-Tazobactam So] Rash    Profuse who body rash on Vanco and Zosyn    Patient Measurements: Height: 4\' 11"  (149.9 cm) Weight: 224 lb 3.3 oz (101.7 kg) IBW/kg (Calculated) : 43.2 Adjusted Body Weight:    Vital Signs: Temp: 99.6 F (37.6 C) (06/26 0641) Temp Source: Oral (06/26 0641) BP: 118/47 (06/26 0641) Pulse Rate: 94 (06/26 0641) Intake/Output from previous day: 06/25 0701 - 06/26 0700 In: 400.3 [P.O.:360; IV Piggyback:40.3] Out: -  Intake/Output from this shift: No intake/output data recorded.  Labs: Recent Labs    07/24/17 0600 07/25/17 0650 07/26/17 0628  WBC 21.8* 21.3* 22.1*  HGB 9.9* 9.8* 11.2*  PLT 394 394 403*  CREATININE 2.76* 1.90* 1.86*   Estimated Creatinine Clearance: 35.5 mL/min (A) (by C-G formula based on SCr of 1.86 mg/dL (H)). Recent Labs    07/23/17 1259  VANCORANDOM 14     Microbiology:   Medical History: Past Medical History:  Diagnosis Date  . Hypertension     Assessment: ID: Abx for cellulitis and r/o sepsis. Tc 99.6., WBC 22.1 up, Scr 9.47> 1.86 declining. RN notes rash spreading over more of her body with itching since 6/25. Discus ed with Dr. Rito EhrlichKrishnan in rounds.  Vanc 6/21 >>6/26 Zosyn 6/21 >>6*26 Ancef 6/26>>  6/23 VR: 14  6/21 BCx >>NGTD  6/21 MRSA PCR: neg   Goal of Therapy:  Eradication of infection  Plan:  D/c Vanco/Zosyn with drug rash. Zosyn added to drug allergies Start Ancef 2g IV q 8 hrs  Rodgerick Gilliand S. Merilynn Finlandobertson, PharmD, BCPS Clinical Staff Pharmacist Pager [phone]  Misty Stanleyobertson, Peyton Rossner Stillinger 07/26/2017,10:38 AM

## 2017-07-26 NOTE — Progress Notes (Signed)
Informed Dr. Rito EhrlichKrishnan that patient Is still itching due to the rash even with Cortizone cream and Benadryl. MD to place new orders.

## 2017-07-27 DIAGNOSIS — L270 Generalized skin eruption due to drugs and medicaments taken internally: Secondary | ICD-10-CM

## 2017-07-27 LAB — CBC
HCT: 33.1 % — ABNORMAL LOW (ref 36.0–46.0)
Hemoglobin: 10.6 g/dL — ABNORMAL LOW (ref 12.0–15.0)
MCH: 27.5 pg (ref 26.0–34.0)
MCHC: 32 g/dL (ref 30.0–36.0)
MCV: 86 fL (ref 78.0–100.0)
PLATELETS: 380 10*3/uL (ref 150–400)
RBC: 3.85 MIL/uL — ABNORMAL LOW (ref 3.87–5.11)
RDW: 14.9 % (ref 11.5–15.5)
WBC: 22.2 10*3/uL — AB (ref 4.0–10.5)

## 2017-07-27 LAB — BASIC METABOLIC PANEL
Anion gap: 8 (ref 5–15)
BUN: 16 mg/dL (ref 6–20)
CALCIUM: 8.4 mg/dL — AB (ref 8.9–10.3)
CHLORIDE: 103 mmol/L (ref 98–111)
CO2: 23 mmol/L (ref 22–32)
CREATININE: 1.52 mg/dL — AB (ref 0.44–1.00)
GFR calc Af Amer: 43 mL/min — ABNORMAL LOW (ref >60)
GFR, EST NON AFRICAN AMERICAN: 37 mL/min — AB (ref >60)
Glucose, Bld: 118 mg/dL — ABNORMAL HIGH (ref 70–99)
Potassium: 4.9 mmol/L (ref 3.5–5.1)
SODIUM: 134 mmol/L — AB (ref 135–145)

## 2017-07-27 MED ORDER — ENSURE ENLIVE PO LIQD
237.0000 mL | Freq: Two times a day (BID) | ORAL | Status: DC
  Administered 2017-07-27 (×2): 237 mL via ORAL

## 2017-07-27 MED ORDER — ADULT MULTIVITAMIN W/MINERALS CH
1.0000 | ORAL_TABLET | Freq: Every day | ORAL | Status: DC
  Administered 2017-07-27 – 2017-07-28 (×2): 1 via ORAL
  Filled 2017-07-27 (×2): qty 1

## 2017-07-27 MED ORDER — METHYLPREDNISOLONE SODIUM SUCC 40 MG IJ SOLR
40.0000 mg | Freq: Two times a day (BID) | INTRAMUSCULAR | Status: DC
  Administered 2017-07-27 (×2): 40 mg via INTRAVENOUS
  Filled 2017-07-27 (×2): qty 1

## 2017-07-27 NOTE — Progress Notes (Signed)
Physical Therapy Treatment Patient Details Name: Aimee Wilson MRN: [id number] DOB: [birth year] Today's Date: 07/27/2017    History of Present Illness Patient is a 57 y/o female presenting with L LE swelling - progressive worseening due to scratching. Admitted with sepsis secondary to cellulitis with meatbolic encephalopathy and AKI due to sepsis. Patient is from [place]. PMH significant for HTN.    PT Comments    Patient still limited with tolerance to ambulation and dependency of the L LE.  Today, however, was able to walk in the room with better tolerance with RW.  Feel she will benefit from continued skilled PT for progressing gait to hallway and to negotiate stairs.   Follow Up Recommendations  Home health PT;Supervision - Intermittent     Equipment Recommendations  Rolling walker with 5" wheels    Recommendations for Other Services       Precautions / Restrictions Precautions Precautions: Fall    Mobility  Bed Mobility Overal bed mobility: Needs Assistance       Supine to sit: Min assist Sit to supine: Min assist   General bed mobility comments: assist to lift trunk to sit, assist for L LE to supnie  Transfers Overall transfer level: Needs assistance Equipment used: Rolling walker (2 wheeled)   Sit to Stand: Min guard         General transfer comment: for safety, pulls up on RW, and slightly impulsive standing prior to IV unplugged, and environmental set up  Ambulation/Gait Ambulation/Gait assistance: Min guard Gait Distance (Feet): 30 Feet Assistive device: Rolling walker (2 wheeled) Gait Pattern/deviations: Step-to pattern;Decreased stride length;Trunk flexed;Antalgic     General Gait Details: demonstrated technique prior to ambulation, able to use walker in room with self limiting weight bearing to tolerance.   Stairs             Wheelchair Mobility    Modified Rankin (Stroke Patients Only)       Balance Overall balance assessment:  Needs assistance Sitting-balance support: No upper extremity supported;Feet supported Sitting balance-Leahy Scale: Good     Standing balance support: Bilateral upper extremity supported Standing balance-Leahy Scale: Poor Standing balance comment: UE support needed for balance                            Cognition Arousal/Alertness: Awake/alert Behavior During Therapy: WFL for tasks assessed/performed Overall Cognitive Status: Within Functional Limits for tasks assessed                                        Exercises General Exercises - Lower Extremity Ankle Circles/Pumps: AROM;5 reps;Seated;Left Long Arc Quad: AROM;5 reps;Left;Seated    General Comments General comments (skin integrity, edema, etc.): daughter in room to translate for pt      Pertinent Vitals/Pain Pain Assessment: 0-10 Pain Score: 5  Pain Location: L LE with weight bearing Pain Descriptors / Indicators: Aching;Grimacing;Guarding Pain Intervention(s): Monitored during session;Repositioned    Home Living                      Prior Function            PT Goals (current goals can now be found in the care plan section) Progress towards PT goals: Progressing toward goals    Frequency    Min 3X/week      PT Plan Current plan remains  appropriate    Co-evaluation              AM-PAC PT "6 Clicks" Daily Activity  Outcome Measure  Difficulty turning over in bed (including adjusting bedclothes, sheets and blankets)?: A Little Difficulty moving from lying on back to sitting on the side of the bed? : Unable Difficulty sitting down on and standing up from a chair with arms (e.g., wheelchair, bedside commode, etc,.)?: Unable Help needed moving to and from a bed to chair (including a wheelchair)?: A Little Help needed walking in hospital room?: A Little Help needed climbing 3-5 steps with a railing? : Total 6 Click Score: 12    End of Session Equipment Utilized  During Treatment: Gait belt Activity Tolerance: Patient limited by pain Patient left: with call bell/phone within reach;in bed;with family/visitor present   PT Visit Diagnosis: Unsteadiness on feet (R26.81);Other abnormalities of gait and mobility (R26.89);Muscle weakness (generalized) (M62.81)     Time: [id number] PT Time Calculation (min) (ACUTE ONLY): 18 min  Charges:  $Gait Training: 8-22 mins                    G CodesSheran Wilson:       Aimee Wilson, Aimee Wilson 07/27/2017    Aimee Wilson 07/27/2017, 3:19 PM

## 2017-07-27 NOTE — Progress Notes (Signed)
[facility] PROGRESS NOTE  Seline Enzor ZOX:[id number] DOB: [birth year] DOA: 07/21/2017  PCP: Patient, No Pcp Per  Brief History/Interval Summary: 57 year old female who is visiting family from the does not speak any English presented with altered mental status and swelling of her leg.  She flew in from by ambulation few days ago.  She was thought to have cellulitis.  She was admitted for further management.  She was also found to have acute kidney injury.  She subsequently developed skin rash thought to be related to antibiotics.  Reason for Visit: Left lower extremity cellulitis.  Consultants: None  Procedures: Left lower extremity venous Doppler was negative for DVT  Antibiotics: Was on vancomycin and Zosyn.  Both have been discontinued.  Ancef initiated.  Subjective/Interval History: Patient's son was in the room and he interpreted.  Patient overall feels better but her rash appears to be more itchy and erythematous.  No new complaints.  Leg feels fine.    ROS: Denies any nausea vomiting or diarrhea  Objective:  Vital Signs  Vitals:   07/26/17 0641 07/26/17 1432 07/26/17 2138 07/27/17 0509  BP: (!) 118/47 125/90 130/72 129/73  Pulse: 94  94 89  Resp: 20 20 18 18   Temp: 99.6 F (37.6 C) 97.6 F (36.4 C) 98.3 F (36.8 C) 98 F (36.7 C)  TempSrc: Oral Oral Oral Oral  SpO2: 100% 100% 98% 98%  Weight:      Height:        Intake/Output Summary (Last 24 hours) at 07/27/2017 1005 Last data filed at 07/27/2017 8119 Gross per 24 hour  Intake 440.12 ml  Output -  Net 440.12 ml   Filed Weights   07/21/17 1628 07/22/17 0025  Weight: 95 kg (209 lb 7 oz) 101.7 kg (224 lb 3.3 oz)    General appearance: Awake alert.  In no distress Resp: Clear to auscultation bilaterally.  No wheezing rales or rhonchi Cardio: S1-S2 is normal regular.  No S3 S4.  No rubs murmurs or bruit GI: Abdomen is soft.  Nontender nondistended.  Bowel sounds are present.  No masses  organomegaly Extremities: Left leg covered with dressing.  Not removed today.  Been examined yesterday erythema and swelling had improved.  Some blisters with weeping.  Good pulses bilaterally.   Skin: Continues to have the maculopapular rash over her trunk and extremities. Neurologic: No focal deficits.  Lab Results:  Data Reviewed: I have personally reviewed following labs and imaging studies  CBC: Recent Labs  Lab 07/21/17 1649  07/23/17 0649 07/24/17 0600 07/25/17 0650 07/26/17 0628 07/27/17 0406  WBC 22.2*   < > 21.5* 21.8* 21.3* 22.1* 22.2*  NEUTROABS 18.9*  --   --  17.1* 16.6* 16.4*  --   HGB 10.5*   < > 9.6* 9.9* 9.8* 11.2* 10.6*  HCT 32.9*   < > 28.5* 30.3* 31.1* 35.7* 33.1*  MCV 84.4   < > 80.5 82.6 84.7 86.9 86.0  PLT 328   < > 349 394 394 403* 380   < > = values in this interval not displayed.    Basic Metabolic Panel: Recent Labs  Lab 07/23/17 0649 07/24/17 0600 07/25/17 0650 07/26/17 0628 07/26/17 1406 07/27/17 0406  NA 134* 137 138 136 133* 134*  K 4.4 5.0 5.4* 5.3* 4.9 4.9  CL 97* 100* 104 101 97* 103  CO2 22 27 26 24 22 23   GLUCOSE 92 94 96 103* 199* 118*  BUN 56* 38* 21* 17 17 16   CREATININE  4.18* 2.76* 1.90* 1.86* 1.87* 1.52*  CALCIUM 8.3* 8.6* 8.8* 8.9 8.4* 8.4*  PHOS 5.1*  --   --   --   --   --     GFR: Estimated Creatinine Clearance: 43.5 mL/min (A) (by C-G formula based on SCr of 1.52 mg/dL (H)).  Liver Function Tests: Recent Labs  Lab 07/21/17 2037 07/23/17 0649  AST 21  --   ALT 25  --   ALKPHOS 154*  --   BILITOT 1.1  --   PROT 7.4  --   ALBUMIN 2.4* 1.9*     Coagulation Profile: Recent Labs  Lab 07/21/17 2037  INR 1.14    Cardiac Enzymes: Recent Labs  Lab 07/21/17 2037  CKTOTAL 48     Recent Results (from the past 240 hour(s))  Blood Culture (routine x 2)     Status: None   Collection Time: 07/21/17  8:58 PM  Result Value Ref Range Status   Specimen Description BLOOD LEFT HAND  Final   Special Requests    Final    BOTTLES DRAWN AEROBIC AND ANAEROBIC Blood Culture results may not be optimal due to an inadequate volume of blood received in culture bottles   Culture   Final    NO GROWTH 5 DAYS Performed at [facility], [address] [place], [place] [address]    Report Status 07/26/2017 FINAL  Final  Blood Culture (routine x 2)     Status: None   Collection Time: 07/21/17  9:05 PM  Result Value Ref Range Status   Specimen Description BLOOD RIGHT ANTECUBITAL  Final   Special Requests   Final    BOTTLES DRAWN AEROBIC AND ANAEROBIC Blood Culture adequate volume   Culture   Final    NO GROWTH 5 DAYS Performed at [facility], [address], [place], [place] [address]    Report Status 07/26/2017 FINAL  Final  MRSA PCR Screening     Status: None   Collection Time: 07/22/17  1:06 AM  Result Value Ref Range Status   MRSA by PCR NEGATIVE NEGATIVE Final    Comment:        The GeneXpert MRSA Assay (FDA approved for NASAL specimens only), is one component of a comprehensive MRSA colonization surveillance program. It is not intended to diagnose MRSA infection nor to guide or monitor treatment for MRSA infections. Performed at [facility], [address] [place], [place] [address]       Radiology Studies: No results found.   Medications:  Scheduled: . heparin  5,000 Units Subcutaneous Q8H  . polyethylene glycol  17 g Oral Daily   Continuous: .  ceFAZolin (ANCEF) IV 2 g (07/27/17 9147)   WGN:FAOZHYQMVHQIO **OR** acetaminophen, hydrocortisone, hydrocortisone cream, hydrOXYzine, metoprolol tartrate, morphine injection, ondansetron **OR** ondansetron (ZOFRAN) IV, oxyCODONE-acetaminophen, PREPARATION H TOTABLES WIPES    Assessment/Plan:   Cellulitis of the left lower extremity suspected sepsis Patient recently traveled from [place]. She likely has chronic venous insufficiency exacerbated by the air travel and nonambulatory state at that time.  No DVT noted  on venous Doppler study.  Patient was started on broad-spectrum antibiotics with vancomycin and Zosyn.  However she did develop skin rash which appears to be drug reaction.  Cellulitis had improved.  Vancomycin and Zosyn were discontinued.  She was started on cefazolin.  MRSA PCR was negative.  Keep legs elevated.  Wound care.  MRI did not show any abscess.  Cultures negative so far.  Drug rash Inciting agent unknown but could be Zosyn or vancomycin.  More likely to be Zosyn.  Both of these antibiotics were discontinued on 6/26.  Patient was started on cefazolin.  Continues to have diffuse rash with severe itching.  Since her underlying infection appears to be improving may be reasonable to try a course of steroids.  We will give her Solu-Medrol.  Continue antihistamine.    Leukocytosis WBC remains high.  She remains afebrile.  WBC would likely remain high now that she has been started on steroids.  Continue to watch for now.    Acute kidney injury with hyperkalemia and hyponatremia and normal anion gap metabolic acidosis Renal function has improved.  Could be close to her baseline.  Potassium level normal today.  Sodium level slightly low but better than before. Bicarbonate level has improved.  IV fluids discontinued.  Acute metabolic encephalopathy Mild confusion likely due to metabolic derangements and acute infection.  Seems to have resolved.  Essential hypertension Home antihypertensive agents were held.  Blood pressure is reasonably well controlled.  Normocytic anemia Monitor hemoglobin.  No evidence of overt bleeding.  DVT Prophylaxis: Subcutaneous heparin    Code Status: Full code Family Communication: Discussed with the patient's son Disposition Plan: Management as outlined above.  Await improvement in the rash.  Hopefully discharge in the next 1 to 2 days.    LOS: 6 days   Osvaldo ShipperGokul Aylana Hirschfeld  [facility] Pager [phone] 07/27/2017, 10:05 AM  If 7PM-7AM, please contact  night-coverage at [email or web address], password [email or web address]

## 2017-07-27 NOTE — Plan of Care (Signed)
Family at bedside.  Did complaint of itching.  Ointment applied with some relieve.  Will continue to monitor.

## 2017-07-27 NOTE — Progress Notes (Signed)
Initial Nutrition Assessment  DOCUMENTATION CODES:   Morbid obesity  INTERVENTION:   -Ensure Enlive po BID, each supplement provides 350 kcal and 20 grams of protein -MVI with minerals daily -Reviewed menu with pt and family; encouraged to order cold foods with less odors to promote increased PO intake  NUTRITION DIAGNOSIS:   Inadequate oral intake related to decreased appetite as evidenced by meal completion < 50%, per patient/family report.  GOAL:   Patient will meet greater than or equal to 90% of their needs  MONITOR:   PO intake, Supplement acceptance, Labs, Weight trends, Skin, I & O's  REASON FOR ASSESSMENT:   Consult Assessment of nutrition requirement/status  ASSESSMENT:   57 year old female with metabolic encephalopathy and sepsis from left lower extremity cellulitis with significant acute kidney injury  Pt admitted with sepsis secondary to cellulitis.   Spoke with pt and family at bedside. Family members (son and daughter-in-law) provided most of the history. Pt began feeling unwell after an extended plan ride from [place]. Pt family describes pt as a very active woman; she has an extremely good appetite at baseline "she eats everything, especially sweets". Pt avoids beef and pork due to religious reasons.   Pt family reports concern over decline in oral intake since hospitalization. Pt typically eats a good breakfast, however, eats very little at lunch and dinner. Pt complains of having a poor appetite related to food odors. She consumes mainly fruit and yogurt (pt has been consuming a lot of mango that her family brings in for her).   Pt family denies that pt has lost weight.   Discussed importance of good meal intake to promote healing. Pt is amenable to Ensure supplements. Also reviewed menu choices and encouraged pt to select cold foods without odors to maximize intake.   Medications reviewed and include solu-medrol.  Labs reviewed: Na: 134.    NUTRITION - FOCUSED PHYSICAL EXAM:    Most Recent Value  Orbital Region  No depletion  Upper Arm Region  No depletion  Thoracic and Lumbar Region  No depletion  Buccal Region  No depletion  Temple Region  No depletion  Clavicle Bone Region  No depletion  Clavicle and Acromion Bone Region  No depletion  Scapular Bone Region  No depletion  Dorsal Hand  No depletion  Anterior Thigh Region  No depletion  Posterior Calf Region  No depletion  Edema (RD Assessment)  Moderate  Hair  Reviewed  Eyes  Reviewed  Mouth  Reviewed  Skin  Reviewed  Nails  Reviewed       Diet Order:   Diet Order           Diet Heart Room service appropriate? Yes; Fluid consistency: Thin  Diet effective now          EDUCATION NEEDS:   Education needs have been addressed  Skin:  Skin Assessment: Skin Integrity Issues: Skin Integrity Issues:: Other (Comment) Other: lt lower leg full thickness and partial thickness wounds (cellulitis)  Last BM:  07/26/17  Height:   Ht Readings from Last 1 Encounters:  07/22/17 4\' 11"  (1.499 m)    Weight:   Wt Readings from Last 1 Encounters:  07/22/17 224 lb 3.3 oz (101.7 kg)    Ideal Body Weight:  44.5 kg  BMI:  Body mass index is 45.28 kg/m.  Estimated Nutritional Needs:   Kcal:  1550-1750  Protein:  90-105 grams  Fluid:  > 1.5 L    Robbie Nangle A. Mayford KnifeWilliams, RD, LDN, CDE  Pager: [phone] After hours Pager: [phone]

## 2017-07-28 MED ORDER — HYDROXYZINE HCL 25 MG PO TABS: 25.0000 mg | ORAL_TABLET | Freq: Three times a day (TID) | ORAL | 0 refills | Status: DC | PRN

## 2017-07-28 MED ORDER — "XEROFORM PETROLAT GAUZE 5""X9"" EX MISC": CUTANEOUS | 0 refills | Status: DC

## 2017-07-28 MED ORDER — PREDNISONE 20 MG PO TABS: ORAL_TABLET | ORAL | 0 refills | Status: DC

## 2017-07-28 MED ORDER — PREDNISONE 50 MG PO TABS
60.0000 mg | ORAL_TABLET | Freq: Every day | ORAL | Status: DC
  Administered 2017-07-28: 60 mg via ORAL
  Filled 2017-07-28: qty 1

## 2017-07-28 MED ORDER — HYDROCORTISONE 1 % EX CREA: TOPICAL_CREAM | Freq: Four times a day (QID) | CUTANEOUS | 0 refills | Status: DC | PRN

## 2017-07-28 MED ORDER — CEPHALEXIN 500 MG PO CAPS
500.0000 mg | ORAL_CAPSULE | Freq: Four times a day (QID) | ORAL | Status: DC
  Administered 2017-07-28: 500 mg via ORAL
  Filled 2017-07-28: qty 1

## 2017-07-28 MED ORDER — OXYCODONE-ACETAMINOPHEN 7.5-325 MG PO TABS: 1.0000 | ORAL_TABLET | Freq: Three times a day (TID) | ORAL | 0 refills | Status: DC | PRN

## 2017-07-28 MED ORDER — CEPHALEXIN 500 MG PO CAPS: 500.0000 mg | ORAL_CAPSULE | Freq: Four times a day (QID) | ORAL | 0 refills | Status: AC

## 2017-07-28 MED ORDER — POLYETHYLENE GLYCOL 3350 17 G PO PACK: 17.0000 g | PACK | Freq: Every day | ORAL | 0 refills | Status: DC

## 2017-07-28 NOTE — Progress Notes (Signed)
Aimee SierrasKanika Show to be D/C'd Home per MD order.  Discussed with the patient and all questions fully answered.  VSS, Skin clean, dry and intact without evidence of skin break down, no evidence of skin tears noted. IV catheter discontinued intact. Site without signs and symptoms of complications. Dressing and pressure applied.  An After Visit Summary was printed and given to the patient. Patient received prescription.  D/c education completed with patient/family including follow up instructions, medication list, d/c activities limitations if indicated, with other d/c instructions as indicated by MD - patient able to verbalize understanding, all questions fully answered.   Patient instructed to return to ED, call 911, or call MD for any changes in condition.   Patient escorted via WC, and D/C home via private auto.  Eligah Eastrin M Merideth Bosque 07/28/2017 3:13 PM

## 2017-07-28 NOTE — Care Management Note (Addendum)
Case Management Note  Patient Details  Name: Aimee Wilson MRN: [id number] Date of Birth: [birth year]  Subjective/Objective:          Admitted with sepsis secondary to cellulitis with meatbolic encephalopathy and AKI due to sepsis. From [place], non english speaking, visiting son/ family.          [name] [facility](Son)     [phone]       07/28/2017 [facility] by calling on 7/1 to scheduled appointment for 7/9. NCM noted on AVS and explained to son /pt .  Action/Plan: Transition to home when medically stable....NCM following for d/c needs Expected Discharge Date:                  Expected Discharge Plan:  Home/Self Care  In-House Referral:     Discharge planning Services  CM Consult  Post Acute Care Choice:    Choice offered to:     DME Arranged:    DME Agency:     HH Arranged:    HH Agency:     Status of Service:  Completed, signed off  If discussed at [employer] Length of Stay Meetings, dates discussed:    Additional Comments:  Epifanio LeschesCole, Ravan Schlemmer Hudson, RN 07/28/2017, 10:13 AM

## 2017-07-28 NOTE — Progress Notes (Signed)
Physical Therapy Treatment Patient Details Name: Aimee Wilson MRN: [id number] DOB: 01/17/1961 Today's Date: 07/28/2017    History of Present Illness Patient is a 57 y/o female presenting with L LE swelling - progressive worseening due to scratching. Admitted with sepsis secondary to cellulitis with meatbolic encephalopathy and AKI due to sepsis. Patient is from [place]. PMH significant for HTN.    PT Comments    Pt able to tolerate increased ambulation distance today and negotiated steps with min guard. Continues to self limit WB on LLE. Current d/c plan continues to remain appropriate. Will continue to follow acutely.    Follow Up Recommendations  Home health PT;Supervision - Intermittent     Equipment Recommendations  Rolling walker with 5" wheels    Recommendations for Other Services       Precautions / Restrictions Precautions Precautions: Fall Restrictions Weight Bearing Restrictions: No    Mobility  Bed Mobility Overal bed mobility: Needs Assistance Bed Mobility: Supine to Sit     Supine to sit: Min guard     General bed mobility comments: increased time and effort with HOB elevated and use of bed rails.  Transfers Overall transfer level: Needs assistance Equipment used: Rolling walker (2 wheeled) Transfers: Sit to/from Stand Sit to Stand: Min guard         General transfer comment: min guard for safety. Pt slightly impulsive, pulling up on RW with RW too far away to reach safely.   Ambulation/Gait Ambulation/Gait assistance: Min guard Gait Distance (Feet): 200 Feet Assistive device: Rolling walker (2 wheeled) Gait Pattern/deviations: Step-to pattern;Decreased stride length;Trunk flexed;Antalgic     General Gait Details: Min guard for safety with cues for RW proximity. Pt self limiting WB tolerance secondary to pain.   Stairs Stairs: Yes Stairs assistance: Min guard Stair Management: One rail Right;Step to pattern;Sideways Number of Stairs:  4 General stair comments: min guard for safety. Pt overall steady negotiating steps. Cues to negotiate sideways for increased use of UEs.   Wheelchair Mobility    Modified Rankin (Stroke Patients Only)       Balance Overall balance assessment: Needs assistance Sitting-balance support: No upper extremity supported;Feet supported Sitting balance-Leahy Scale: Good     Standing balance support: Bilateral upper extremity supported Standing balance-Leahy Scale: Poor Standing balance comment: UE support needed for balance                            Cognition Arousal/Alertness: Awake/alert Behavior During Therapy: WFL for tasks assessed/performed Overall Cognitive Status: Within Functional Limits for tasks assessed                                        Exercises      General Comments General comments (skin integrity, edema, etc.): daughter present and acting as Nurse, learning disability      Pertinent Vitals/Pain Pain Assessment: Faces Faces Pain Scale: Hurts little more Pain Location: L LE with weight bearing Pain Descriptors / Indicators: Aching;Grimacing;Guarding Pain Intervention(s): Monitored during session;Limited activity within patient's tolerance    Home Living                      Prior Function            PT Goals (current goals can now be found in the care plan section) Acute Rehab PT Goals Patient Stated Goal: none stated  PT Goal Formulation: With patient Time For Goal Achievement: 08/09/17 Potential to Achieve Goals: Good Progress towards PT goals: Progressing toward goals    Frequency    Min 3X/week      PT Plan Current plan remains appropriate    Co-evaluation              AM-PAC PT "6 Clicks" Daily Activity  Outcome Measure  Difficulty turning over in bed (including adjusting bedclothes, sheets and blankets)?: A Little Difficulty moving from lying on back to sitting on the side of the bed? :  Unable Difficulty sitting down on and standing up from a chair with arms (e.g., wheelchair, bedside commode, etc,.)?: Unable Help needed moving to and from a bed to chair (including a wheelchair)?: A Little Help needed walking in hospital room?: A Little Help needed climbing 3-5 steps with a railing? : A Little 6 Click Score: 14    End of Session Equipment Utilized During Treatment: Gait belt Activity Tolerance: Patient tolerated treatment well Patient left: with call bell/phone within reach;in bed;with family/visitor present Nurse Communication: Mobility status PT Visit Diagnosis: Unsteadiness on feet (R26.81);Other abnormalities of gait and mobility (R26.89);Muscle weakness (generalized) (M62.81)     Time: 1013-1030 PT Time Calculation (min) (ACUTE ONLY): 17 min  Charges:  $Gait Training: 8-22 mins                    G Codes:      Kallie LocksHannah Rashid [place], [place] Pager [phone] Acute Rehab  Sheral ApleyHannah E Rondi Ivy 07/28/2017, 11:25 AM

## 2017-07-28 NOTE — Discharge Summary (Signed)
[facility]  Physician Discharge Summary   Patient ID: Aimee Wilson MRN: [id number] DOB/AGE: 09-21-1960 57 y.o.  Admit date: 07/21/2017 Discharge date: 07/28/2017  PCP: From out of country  DISCHARGE DIAGNOSES:  Left lower extremity cellulitis Drug rash  RECOMMENDATIONS FOR OUTPATIENT FOLLOW UP: 1. Family to meet the health and wellness center on July 1 to arrange follow-up  DISCHARGE CONDITION: fair  Diet recommendation: As before  [facility] Weights   07/21/17 1628 07/22/17 0025  Weight: 95 kg (209 lb 7 oz) 101.7 kg (224 lb 3.3 oz)    INITIAL HISTORY: 57 year old female who is visiting family from the does not speak any English presented with altered mental status and swelling of her leg.  She flew in from by ambulation few days ago.  She was thought to have cellulitis.  She was admitted for further management.  She was also found to have acute kidney injury.  She subsequently developed skin rash thought to be related to antibiotics.  Consultants: None  Procedures: Left lower extremity venous Doppler was negative for DVT   HOSPITAL COURSE:    Cellulitis of the left lower extremity suspected sepsis Patient recently traveled from [place]. She likely has chronic venous insufficiency exacerbated by the air travel and nonambulatory state at that time.  No DVT noted on venous Doppler study.    She had changes suspicious for cellulitis.  Patient was started on broad-spectrum antibiotics with vancomycin and Zosyn.    Had open wounds with a lot of weeping.  She was seen by wound care nurse.  Dressing changes were applied.  Wound and cellulitis has improved significantly.  She was asked to keep her legs elevated.  Will be discharged on Keflex.  Dressing changes to be done by family members.  She also underwent MRI which did not show any abscess.  Cultures have been negative so far.    Drug rash Inciting agent unknown but could be Zosyn or vancomycin.  More likely to be  Zosyn.  Both of these antibiotics were discontinued on 6/26.  Patient was started on cefazolin.  Continues to have diffuse rash with severe itching.  Since her underlying infection appears to be improving it was felt that a course of steroids might help.  She was started on Solu-Medrol.  Itching has significantly improved today.  Rash persists but will probably take many days to resolve.  She will be given a quick taper of steroids.  Can continue antihistamines at home.    Leukocytosis WBC remains high.  She remains afebrile.  WBC would likely remain high now that she has been started on steroids.   Acute kidney injury with hyperkalemia and hyponatremia and normal anion gap metabolic acidosis Renal function has improved.  Could be close to her baseline.  Potassium level improved.  Sodium remains low but better than before.    Acute metabolic encephalopathy Mild confusion likely due to metabolic derangements and acute infection.    She is back to her baseline now.  Essential hypertension Continue home medications  Normocytic anemia Hemoglobin stable.  No evidence for overt bleeding.  Ideally she should be followed in the outpatient setting in a week to 10 days.  Since she is visiting from out of country, case manager has arranged for her to be seen at the community health and wellness clinic as patient does not have a PCP here.  Patient and family to call on July 1 to get appointment.  Overall stable.  Patient very keen on going home.  She is afebrile.  Wound has improved.  She feels much better.  She has ambulated with physical therapy.  She has good support system at home.  Okay for discharge home today.     PERTINENT LABS:  The results of significant diagnostics from this hospitalization (including imaging, microbiology, ancillary and laboratory) are listed below for reference.    Microbiology: Recent Results (from the past 240 hour(s))  Blood Culture (routine x 2)     Status:  None   Collection Time: 07/21/17  8:58 PM  Result Value Ref Range Status   Specimen Description BLOOD LEFT HAND  Final   Special Requests   Final    BOTTLES DRAWN AEROBIC AND ANAEROBIC Blood Culture results may not be optimal due to an inadequate volume of blood received in culture bottles   Culture   Final    NO GROWTH 5 DAYS Performed at [facility], [address] [place], [place] [address]    Report Status 07/26/2017 FINAL  Final  Blood Culture (routine x 2)     Status: None   Collection Time: 07/21/17  9:05 PM  Result Value Ref Range Status   Specimen Description BLOOD RIGHT ANTECUBITAL  Final   Special Requests   Final    BOTTLES DRAWN AEROBIC AND ANAEROBIC Blood Culture adequate volume   Culture   Final    NO GROWTH 5 DAYS Performed at [facility], [address] [place], [place] [address]    Report Status 07/26/2017 FINAL  Final  MRSA PCR Screening     Status: None   Collection Time: 07/22/17  1:06 AM  Result Value Ref Range Status   MRSA by PCR NEGATIVE NEGATIVE Final    Comment:        The GeneXpert MRSA Assay (FDA approved for NASAL specimens only), is one component of a comprehensive MRSA colonization surveillance program. It is not intended to diagnose MRSA infection nor to guide or monitor treatment for MRSA infections. Performed at [facility], [address] [place], [place] [address]      Labs: Basic Metabolic Panel: Recent Labs  Lab 07/23/17 0649 07/24/17 0600 07/25/17 0650 07/26/17 0628 07/26/17 1406 07/27/17 0406  NA 134* 137 138 136 133* 134*  K 4.4 5.0 5.4* 5.3* 4.9 4.9  CL 97* 100* 104 101 97* 103  CO2 22 27 26 24 22 23   GLUCOSE 92 94 96 103* 199* 118*  BUN 56* 38* 21* 17 17 16   CREATININE 4.18* 2.76* 1.90* 1.86* 1.87* 1.52*  CALCIUM 8.3* 8.6* 8.8* 8.9 8.4* 8.4*  PHOS 5.1*  --   --   --   --   --    Liver Function Tests: Recent Labs  Lab 07/21/17 2037 07/23/17 0649  AST 21  --   ALT 25  --   ALKPHOS 154*   --   BILITOT 1.1  --   PROT 7.4  --   ALBUMIN 2.4* 1.9*   CBC: Recent Labs  Lab 07/21/17 1649  07/23/17 0649 07/24/17 0600 07/25/17 0650 07/26/17 0628 07/27/17 0406  WBC 22.2*   < > 21.5* 21.8* 21.3* 22.1* 22.2*  NEUTROABS 18.9*  --   --  17.1* 16.6* 16.4*  --   HGB 10.5*   < > 9.6* 9.9* 9.8* 11.2* 10.6*  HCT 32.9*   < > 28.5* 30.3* 31.1* 35.7* 33.1*  MCV 84.4   < > 80.5 82.6 84.7 86.9 86.0  PLT 328   < >  349 394 394 403* 380   < > = values in this interval not displayed.   Cardiac Enzymes: Recent Labs  Lab 07/21/17 2037  CKTOTAL 48     IMAGING STUDIES US Renal  Result Date: 07/21/2017 CLINICAL DATA:  Acute kidney injury today. Left lower extremity swelling. Hypertension and sepsis. EXAM: RENAL / URINARY TRACT ULTRASOUND COMPLETE COMPARISON:  None. FINDINGS: Right Kidney: Length: 12.4 cm. Echogenicity within normal limits. No mass or hydronephrosis visualized. Left Kidney: Length: 12.6 cm. Echogenicity within normal limits. No mass or hydronephrosis visualized. Bladder: Appears normal for degree of bladder distention. IMPRESSION: Normal ultrasound appearance of the kidneys and bladder. No hydronephrosis. Electronically Signed   By: Burman Nieves M.D.   On: 07/21/2017 23:51   Mr Tibia Fibula Left Wo Contrast  Result Date: 07/23/2017 CLINICAL DATA:  Left lower extremity pain and swelling. EXAM: MRI OF LOWER LEFT EXTREMITY WITHOUT CONTRAST TECHNIQUE: Multiplanar, multisequence MR imaging of the left lower extremity was performed. No intravenous contrast was administered. COMPARISON:  Radiograph 07/21/2017 FINDINGS: Extensive subcutaneous soft tissue swelling/edema/fluid and skin thickening and edema most consistent with diffuse cellulitis. There is more focal fluid like signal changes surrounding the deep fascia but no findings to suggest myofasciitis or pyomyositis. No discrete fluid collection to suggest a drainable soft tissue abscess. No MR findings to suggest septic  arthritis or osteomyelitis. IMPRESSION: 1. Diffuse severe left lower extremity cellulitis without discrete drainable soft tissue abscess. 2. No findings for myofasciitis, pyomyositis, septic arthritis or osteomyelitis. Electronically Signed   By: Rudie Meyer M.D.   On: 07/23/2017 12:47   Dg Chest Portable 1 View  Result Date: 07/21/2017 CLINICAL DATA:  Sepsis cellulitis infection EXAM: PORTABLE CHEST 1 VIEW COMPARISON:  None. FINDINGS: The heart size and mediastinal contours are within normal limits. Both lungs are clear. The visualized skeletal structures are unremarkable. IMPRESSION: No active disease. Electronically Signed   By: Jasmine Pang M.D.   On: 07/21/2017 21:30   Dg Tibia/fibula Left Port  Result Date: 07/21/2017 CLINICAL DATA:  Sepsis, cellulitis and infection EXAM: PORTABLE LEFT TIBIA AND FIBULA - 2 VIEW COMPARISON:  None. FINDINGS: Extensive edema within the soft tissues. No fracture or malalignment. No periostitis or bone destruction. No radiopaque foreign body in the soft tissues. IMPRESSION: Soft tissue edema.  No acute osseous abnormality Electronically Signed   By: Jasmine Pang M.D.   On: 07/21/2017 21:30   Dg Ankle Left Port  Result Date: 07/21/2017 CLINICAL DATA:  Sepsis, cellulitis and infection EXAM: PORTABLE LEFT ANKLE - 2 VIEW COMPARISON:  None. FINDINGS: Extensive soft tissue edema. No soft tissue gas or radiopaque foreign body. No fracture or malalignment. No periostitis or bone destruction. Ankle mortise is symmetric. IMPRESSION: Large amount of soft tissue edema without acute osseous abnormality Electronically Signed   By: Jasmine Pang M.D.   On: 07/21/2017 21:28    DISCHARGE EXAMINATION: Vitals:   07/27/17 0509 07/27/17 1300 07/27/17 2215 07/28/17 0552  BP: 129/73 124/69 (!) 110/53 127/64  Pulse: 89 78 76 83  Resp: 18 20 17 17   Temp: 98 F (36.7 C) 97.8 F (36.6 C) 99 F (37.2 C) 98.7 F (37.1 C)  TempSrc: Oral Oral Oral Oral  SpO2: 98% 98% 96% 99%    Weight:      Height:       General appearance: alert, cooperative, appears stated age and no distress Resp: clear to auscultation bilaterally Cardio: regular rate and rhythm, S1, S2 normal, no murmur, click, rub or gallop  GI: soft, non-tender; bowel sounds normal; no masses,  no organomegaly Left leg was examined again today.  Dressing was removed.  Swelling has significantly improved.  Improved erythema.  Not as much drainage as before.  DISPOSITION: Home  Discharge Instructions    Call MD for:  difficulty breathing, headache or visual disturbances   Complete by:  As directed    Call MD for:  extreme fatigue   Complete by:  As directed    Call MD for:  persistant dizziness or light-headedness   Complete by:  As directed    Call MD for:  persistant nausea and vomiting   Complete by:  As directed    Call MD for:  severe uncontrolled pain   Complete by:  As directed    Call MD for:  temperature >100.4   Complete by:  As directed    Diet general   Complete by:  As directed    Discharge instructions   Complete by:  As directed    Please take all your medications as prescribed.  Do wound care as instructed by nursing staff.  Continue immediately if patient develops high fevers or chills or if the redness in the leg starts getting worse.  Seek attention if she starts wheezing or gets short of breath.  The rash will take many days to subside.  You were cared for by a hospitalist during your hospital stay. If you have any questions about your discharge medications or the care you received while you were in the hospital after you are discharged, you can call the unit and asked to speak with the hospitalist on call if the hospitalist that took care of you is not available. Once you are discharged, your primary care physician will handle any further medical issues. Please note that NO REFILLS for any discharge medications will be authorized once you are discharged, as it is imperative that you  return to your primary care physician (or establish a relationship with a primary care physician if you do not have one) for your aftercare needs so that they can reassess your need for medications and monitor your lab values. If you do not have a primary care physician, you can call [phone] for a physician referral.   Increase activity slowly   Complete by:  As directed         Allergies as of 07/28/2017      Reactions   Beef-derived Products    Zosyn [piperacillin Sod-tazobactam So] Rash   Profuse who body rash on Vanco and Zosyn      Medication List    TAKE these medications   amitriptyline 10 MG tablet Commonly known as:  ELAVIL Take 10 mg by mouth as needed for sleep.   cephALEXin 500 MG capsule Commonly known as:  KEFLEX Take 1 capsule (500 mg total) by mouth every 6 (six) hours for 10 days.   hydrocortisone cream 1 % Apply topically 4 (four) times daily as needed for itching.   hydrOXYzine 25 MG tablet Commonly known as:  ATARAX/VISTARIL Take 1 tablet (25 mg total) by mouth 3 (three) times daily as needed (refractory itching).   oxyCODONE-acetaminophen 7.5-325 MG tablet Commonly known as:  PERCOCET Take 1 tablet by mouth every 8 (eight) hours as needed for severe pain.   polyethylene glycol packet Commonly known as:  MIRALAX / GLYCOLAX Take 17 g by mouth daily. Start taking on:  07/29/2017   predniSONE 20 MG tablet Commonly known as:  DELTASONE Take 3  tablets once daily for 3 days, then take 2 tablets once daily for 3 days, then take 1 tablet once daily for 3 days then STOP.   pregabalin 50 MG capsule Commonly known as:  LYRICA Take 50 mg by mouth as needed (pain).   PRESCRIPTION MEDICATION Take 1 tablet by mouth daily. Combination drug: Torsemide-Spironolactone 5mg -50mg    promethazine 25 MG tablet Commonly known as:  PHENERGAN Take 25 mg by mouth every 6 (six) hours as needed for nausea or vomiting.   RABEprazole 20 MG tablet Commonly known as:   ACIPHEX Take 20 mg by mouth daily as needed (heartburn, acid reflux).   rosuvastatin 10 MG tablet Commonly known as:  CRESTOR Take 10 mg by mouth daily.   XEROFORM PETROLAT GAUZE 5"X9" Misc Apply on leg wound to cover entire area. May need upto 4 at one time. Change daily.        Follow-up Information    [facility]. Call on 07/31/2017.   Why:  Please call on July 1st to arrange post hospital follow up appointment for July 9th. Contact information: [address] [place] [address] [phone]          TOTAL DISCHARGE TIME: 35 minutes  Osvaldo Shipper  [facility] Pager [phone]  07/28/2017, 2:37 PM

## 2017-07-28 NOTE — Discharge Instructions (Signed)

## 2017-08-08 ENCOUNTER — Encounter: Payer: Self-pay | Admitting: Family Medicine

## 2017-08-08 ENCOUNTER — Inpatient Hospital Stay: Payer: PRIVATE HEALTH INSURANCE

## 2017-08-08 ENCOUNTER — Ambulatory Visit: Payer: 59 | Attending: Family Medicine | Admitting: Family Medicine

## 2017-08-08 VITALS — BP 113/82 | HR 87 | Temp 98.0°F | Wt 201.4 lb

## 2017-08-08 DIAGNOSIS — Z91018 Allergy to other foods: Secondary | ICD-10-CM | POA: Diagnosis not present

## 2017-08-08 DIAGNOSIS — Z79899 Other long term (current) drug therapy: Secondary | ICD-10-CM | POA: Diagnosis not present

## 2017-08-08 DIAGNOSIS — F419 Anxiety disorder, unspecified: Secondary | ICD-10-CM | POA: Diagnosis not present

## 2017-08-08 DIAGNOSIS — Z880 Allergy status to penicillin: Secondary | ICD-10-CM | POA: Insufficient documentation

## 2017-08-08 DIAGNOSIS — L03116 Cellulitis of left lower limb: Secondary | ICD-10-CM | POA: Diagnosis not present

## 2017-08-08 DIAGNOSIS — Z09 Encounter for follow-up examination after completed treatment for conditions other than malignant neoplasm: Secondary | ICD-10-CM | POA: Diagnosis present

## 2017-08-08 DIAGNOSIS — Z79811 Long term (current) use of aromatase inhibitors: Secondary | ICD-10-CM | POA: Diagnosis not present

## 2017-08-08 DIAGNOSIS — N179 Acute kidney failure, unspecified: Secondary | ICD-10-CM

## 2017-08-08 DIAGNOSIS — D72829 Elevated white blood cell count, unspecified: Secondary | ICD-10-CM | POA: Insufficient documentation

## 2017-08-08 DIAGNOSIS — R4182 Altered mental status, unspecified: Secondary | ICD-10-CM | POA: Diagnosis not present

## 2017-08-08 DIAGNOSIS — Z7952 Long term (current) use of systemic steroids: Secondary | ICD-10-CM | POA: Diagnosis not present

## 2017-08-08 DIAGNOSIS — I10 Essential (primary) hypertension: Secondary | ICD-10-CM | POA: Diagnosis not present

## 2017-08-08 DIAGNOSIS — Z79891 Long term (current) use of opiate analgesic: Secondary | ICD-10-CM | POA: Insufficient documentation

## 2017-08-08 DIAGNOSIS — M7989 Other specified soft tissue disorders: Secondary | ICD-10-CM | POA: Insufficient documentation

## 2017-08-08 MED ORDER — CEPHALEXIN 500 MG PO CAPS: 500.0000 mg | ORAL_CAPSULE | Freq: Two times a day (BID) | ORAL | 0 refills | Status: DC

## 2017-08-08 NOTE — Progress Notes (Signed)
Subjective:  Patient ID: Aimee Wilson, female    DOB: Jul 04, 1960  Age: 57 y.o. MRN: [id number]  CC: Hospitalization Follow-up   HPI Aimee Wilson is a 57 year old female from [place] with a history of hypertension, anxiety who presents today to establish care after hospitalization at [facility] from 07/21/2017 through 07/28/2017 for left lower extremity cellulitis and metabolic encephalopathy secondary to sepsis. She had presented to the ED after a 33-hour flight from [place] with altered mental status, left lower extremity swelling.  Labs revealed leukocytosis of 22,000 and acute kidney injury with creatinine of 9.67.  She was commenced on vancomycin and Zosyn but developed a rash and so this was discontinued and she was placed on Solu-Medrol. MRI L tibia and fibula revealed diffuse left lower extremity cellulitis without discrete drainable soft tissue abscess, negative for osteomyelitis.  Her condition improved with IV fluid with resolution of confusion and creatinine trended down to 1.52 at discharge but WBC remained elevated likely from steroid effect.  She was discharged on Keflex and a prednisone taper.  She is accompanied by her son today and has completed her course of antibiotics and steroids and reports improvement.  Left leg dressing is performed daily and she tries to elevate her feet.  Denies fever and swelling has improved.  She will be returning back to [place] in 08/29/2017.  Past Medical History:  Diagnosis Date  . Hypertension     History reviewed. No pertinent surgical history.  Allergies  Allergen Reactions  . Beef-Derived Products   . Zosyn [Piperacillin Sod-Tazobactam So] Rash    Profuse who body rash on Vanco and Zosyn     Outpatient Medications Prior to Visit  Medication Sig Dispense Refill  . hydrOXYzine (ATARAX/VISTARIL) 25 MG tablet Take 1 tablet (25 mg total) by mouth 3 (three) times daily as needed (refractory itching). 30 tablet 0  .  RABEprazole (ACIPHEX) 20 MG tablet Take 20 mg by mouth daily as needed (heartburn, acid reflux).    . rosuvastatin (CRESTOR) 10 MG tablet Take 10 mg by mouth daily.    Marland Kitchen amitriptyline (ELAVIL) 10 MG tablet Take 10 mg by mouth as needed for sleep.    . Bismuth Tribromoph-Petrolatum (XEROFORM PETROLAT GAUZE 5"X9") MISC Apply on leg wound to cover entire area. May need upto 4 at one time. Change daily. (Patient not taking: Reported on 08/08/2017) 120 each 0  . hydrocortisone cream 1 % Apply topically 4 (four) times daily as needed for itching. (Patient not taking: Reported on 08/08/2017) 30 g 0  . oxyCODONE-acetaminophen (PERCOCET) 7.5-325 MG tablet Take 1 tablet by mouth every 8 (eight) hours as needed for severe pain. (Patient not taking: Reported on 08/08/2017) 15 tablet 0  . polyethylene glycol (MIRALAX / GLYCOLAX) packet Take 17 g by mouth daily. (Patient not taking: Reported on 08/08/2017) 14 each 0  . predniSONE (DELTASONE) 20 MG tablet Take 3 tablets once daily for 3 days, then take 2 tablets once daily for 3 days, then take 1 tablet once daily for 3 days then STOP. (Patient not taking: Reported on 08/08/2017) 18 tablet 0  . pregabalin (LYRICA) 50 MG capsule Take 50 mg by mouth as needed (pain).    Marland Kitchen PRESCRIPTION MEDICATION Take 1 tablet by mouth daily. Combination drug: Torsemide-Spironolactone 87m-50mg    . promethazine (PHENERGAN) 25 MG tablet Take 25 mg by mouth every 6 (six) hours as needed for nausea or vomiting.     No facility-administered medications prior to visit.  ROS Review of Systems  Constitutional: Negative for activity change, appetite change and fatigue.  HENT: Negative for congestion, sinus pressure and sore throat.   Eyes: Negative for visual disturbance.  Respiratory: Negative for cough, chest tightness, shortness of breath and wheezing.   Cardiovascular: Negative for chest pain and palpitations.  Gastrointestinal: Negative for abdominal distention, abdominal pain and  constipation.  Endocrine: Negative for polydipsia.  Genitourinary: Negative for dysuria and frequency.  Musculoskeletal:       See hpi  Skin: Negative for rash.  Neurological: Negative for tremors, light-headedness and numbness.  Hematological: Does not bruise/bleed easily.  Psychiatric/Behavioral: Negative for agitation and behavioral problems.    Objective:  BP 113/82   Pulse 87   Temp 98 F (36.7 C) (Oral)   Wt 201 lb 6.4 oz (91.4 kg)   SpO2 97%   BMI 40.68 kg/m   BP/Weight 08/08/2017 07/28/2017 3/83/3383  Systolic BP 291 916 -  Diastolic BP 82 64 -  Wt. (Lbs) 201.4 - 224.21  BMI 40.68 - -      Physical Exam  Constitutional: She is oriented to person, place, and time. She appears well-developed and well-nourished.  Cardiovascular: Normal rate, normal heart sounds and intact distal pulses.  No murmur heard. Pulmonary/Chest: Effort normal and breath sounds normal. She has no wheezes. She has no rales. She exhibits no tenderness.  Abdominal: Soft. Bowel sounds are normal. She exhibits no distension and no mass. There is no tenderness.  Musculoskeletal:  Left leg edema with patches of erythema, no tenderness Patches of excoriation of skin on sole of left foot  Neurological: She is alert and oriented to person, place, and time.  Psychiatric: She has a normal mood and affect.     CMP Latest Ref Rng & Units 07/27/2017 07/26/2017 07/26/2017  Glucose 70 - 99 mg/dL 118(H) 199(H) 103(H)  BUN 6 - 20 mg/dL _0 Creatinine 0.44 - 1.00 mg/dL 1.52(H) 1.87(H) 1.86(H)  Sodium 135 - 145 mmol/L 134(L) 133(L) 136  Potassium 3.5 - 5.1 mmol/L 4.9 4.9 5.3(H)  Chloride 98 - 111 mmol/L 103 97(L) 101  CO2 22 - 32 mmol/L _1 Calcium 8.9 - 10.3 mg/dL 8.4(L) 8.4(L) 8.9  Total Protein 6.5 - 8.1 g/dL - - -  Total Bilirubin 0.3 - 1.2 mg/dL - - -  Alkaline Phos 38 - 126 U/L - - -  AST 15 - 41 U/L - - -  ALT 14 - 54 U/L - - -    CBC    Component Value Date/Time   WBC 22.2 (H)  07/27/2017 0406   RBC 3.85 (L) 07/27/2017 0406   HGB 10.6 (L) 07/27/2017 0406   HCT 33.1 (L) 07/27/2017 0406   PLT 380 07/27/2017 0406   MCV 86.0 07/27/2017 0406   MCH 27.5 07/27/2017 0406   MCHC 32.0 07/27/2017 0406   RDW 14.9 07/27/2017 0406   LYMPHSABS 3.3 07/26/2017 0628   MONOABS 2.2 (H) 07/26/2017 0628   EOSABS 0.2 07/26/2017 0628   BASOSABS 0.0 07/26/2017 0628    Assessment & Plan:   1. Cellulitis of left lower extremity Placed on another round of Keflex (previously had rash with Zosyn and vancomycin but did well on oral Keflex) Continue wrapping lower extremity Advised to elevate left leg and avoid dependent position as much as possible We will see back prior to her return to [place] to ensure complete resolution - CBC with Differential/Platelet  2. AKI (acute kidney injury) ([place]) Creatinine at discharge  was 1.52 We will check again - CMP14+EGFR   Meds ordered this encounter  Medications  . cephALEXin (KEFLEX) 500 MG capsule    Sig: Take 1 capsule (500 mg total) by mouth 2 (two) times daily.    Dispense:  14 capsule    Refill:  0    Follow-up: Return in about 2 weeks (around 08/22/2017) for Follow-up of cellulitis.   Charlott Rakes MD

## 2017-08-09 LAB — CBC WITH DIFFERENTIAL/PLATELET
BASOS: 1 %
Basophils Absolute: 0.1 10*3/uL (ref 0.0–0.2)
EOS (ABSOLUTE): 0.6 10*3/uL — ABNORMAL HIGH (ref 0.0–0.4)
EOS: 6 %
HEMATOCRIT: 32.3 % — AB (ref 34.0–46.6)
HEMOGLOBIN: 10.6 g/dL — AB (ref 11.1–15.9)
IMMATURE GRANS (ABS): 0.1 10*3/uL (ref 0.0–0.1)
Immature Granulocytes: 1 %
LYMPHS ABS: 2.5 10*3/uL (ref 0.7–3.1)
Lymphs: 29 %
MCH: 27.6 pg (ref 26.6–33.0)
MCHC: 32.8 g/dL (ref 31.5–35.7)
MCV: 84 fL (ref 79–97)
MONOCYTES: 9 %
Monocytes Absolute: 0.8 10*3/uL (ref 0.1–0.9)
NEUTROS ABS: 4.7 10*3/uL (ref 1.4–7.0)
Neutrophils: 54 %
Platelets: 336 10*3/uL (ref 150–450)
RBC: 3.84 x10E6/uL (ref 3.77–5.28)
RDW: 14.3 % (ref 12.3–15.4)
WBC: 8.7 10*3/uL (ref 3.4–10.8)

## 2017-08-09 LAB — CMP14+EGFR
ALBUMIN: 3.8 g/dL (ref 3.5–5.5)
ALT: 27 IU/L (ref 0–32)
AST: 17 IU/L (ref 0–40)
Albumin/Globulin Ratio: 1 — ABNORMAL LOW (ref 1.2–2.2)
Alkaline Phosphatase: 164 IU/L — ABNORMAL HIGH (ref 39–117)
BILIRUBIN TOTAL: 0.4 mg/dL (ref 0.0–1.2)
BUN / CREAT RATIO: 15 (ref 9–23)
BUN: 16 mg/dL (ref 6–24)
CALCIUM: 9.1 mg/dL (ref 8.7–10.2)
CO2: 20 mmol/L (ref 20–29)
Chloride: 96 mmol/L (ref 96–106)
Creatinine, Ser: 1.08 mg/dL — ABNORMAL HIGH (ref 0.57–1.00)
GFR, EST AFRICAN AMERICAN: 66 mL/min/{1.73_m2} (ref >59)
GFR, EST NON AFRICAN AMERICAN: 58 mL/min/{1.73_m2} — AB (ref >59)
GLUCOSE: 114 mg/dL — AB (ref 65–99)
Globulin, Total: 3.7 g/dL (ref 1.5–4.5)
Potassium: 4.3 mmol/L (ref 3.5–5.2)
Sodium: 132 mmol/L — ABNORMAL LOW (ref 134–144)
TOTAL PROTEIN: 7.5 g/dL (ref 6.0–8.5)

## 2017-08-11 ENCOUNTER — Inpatient Hospital Stay: Payer: PRIVATE HEALTH INSURANCE | Admitting: Family Medicine

## 2017-08-22 ENCOUNTER — Encounter: Payer: Self-pay | Admitting: Family Medicine

## 2017-08-22 ENCOUNTER — Ambulatory Visit: Payer: 59 | Attending: Family Medicine | Admitting: Family Medicine

## 2017-08-22 VITALS — BP 127/65 | HR 100 | Temp 98.0°F | Wt 204.0 lb

## 2017-08-22 DIAGNOSIS — F419 Anxiety disorder, unspecified: Secondary | ICD-10-CM | POA: Diagnosis not present

## 2017-08-22 DIAGNOSIS — L03116 Cellulitis of left lower limb: Secondary | ICD-10-CM | POA: Diagnosis not present

## 2017-08-22 DIAGNOSIS — Z79899 Other long term (current) drug therapy: Secondary | ICD-10-CM | POA: Diagnosis not present

## 2017-08-22 DIAGNOSIS — I10 Essential (primary) hypertension: Secondary | ICD-10-CM | POA: Diagnosis present

## 2017-08-22 DIAGNOSIS — Z880 Allergy status to penicillin: Secondary | ICD-10-CM | POA: Insufficient documentation

## 2017-08-22 NOTE — Progress Notes (Signed)
Subjective:  Patient ID: Aimee Wilson, female    DOB: 06/23/1960  Age: 57 y.o. MRN: [id number]  CC: Hypertension   HPI Aimee Wilson  is a 57 year old female from [place] with a history of hypertension, anxiety who presents today for follow-up of left leg cellulitis.  She had a hospitalization last month for left leg cellulitis,sepsis with acute metabolic encephalopathy and was found to have acute kidney injury. She was treated with IV antibiotics and completed Keflex on discharge and at her last office visit with me 3 weeks ago I had repeated her course of Keflex. Her left leg has improved with improved edema and she is no longer having to wrap her leg.  White blood count has normalized also has her renal function. Since her last visit she has undertaken a trip to [place] and was able to tolerate the walking and the long trip with intermittent breaks. She is planning to return to her home country of [place] next week and will be requiring a letter for the airlines to assist with a seat with elevated red leg raise  Past Medical History:  Diagnosis Date  . Hypertension     No past surgical history on file.  Allergies  Allergen Reactions  . Beef-Derived Products   . Zosyn [Piperacillin Sod-Tazobactam So] Rash    Profuse who body rash on Vanco and Zosyn     Outpatient Medications Prior to Visit  Medication Sig Dispense Refill  . rosuvastatin (CRESTOR) 10 MG tablet Take 10 mg by mouth daily.    Marland Kitchen amitriptyline (ELAVIL) 10 MG tablet Take 10 mg by mouth as needed for sleep.    . Bismuth Tribromoph-Petrolatum (XEROFORM PETROLAT GAUZE 5"X9") MISC Apply on leg wound to cover entire area. May need upto 4 at one time. Change daily. (Patient not taking: Reported on 08/08/2017) 120 each 0  . cephALEXin (KEFLEX) 500 MG capsule Take 1 capsule (500 mg total) by mouth 2 (two) times daily. (Patient not taking: Reported on 08/22/2017) 14 capsule 0  . hydrocortisone cream 1 % Apply topically 4  (four) times daily as needed for itching. (Patient not taking: Reported on 08/08/2017) 30 g 0  . hydrOXYzine (ATARAX/VISTARIL) 25 MG tablet Take 1 tablet (25 mg total) by mouth 3 (three) times daily as needed (refractory itching). (Patient not taking: Reported on 08/22/2017) 30 tablet 0  . oxyCODONE-acetaminophen (PERCOCET) 7.5-325 MG tablet Take 1 tablet by mouth every 8 (eight) hours as needed for severe pain. (Patient not taking: Reported on 08/08/2017) 15 tablet 0  . polyethylene glycol (MIRALAX / GLYCOLAX) packet Take 17 g by mouth daily. (Patient not taking: Reported on 08/08/2017) 14 each 0  . predniSONE (DELTASONE) 20 MG tablet Take 3 tablets once daily for 3 days, then take 2 tablets once daily for 3 days, then take 1 tablet once daily for 3 days then STOP. (Patient not taking: Reported on 08/08/2017) 18 tablet 0  . pregabalin (LYRICA) 50 MG capsule Take 50 mg by mouth as needed (pain).    Marland Kitchen PRESCRIPTION MEDICATION Take 1 tablet by mouth daily. Combination drug: Torsemide-Spironolactone 5mg -50mg     . promethazine (PHENERGAN) 25 MG tablet Take 25 mg by mouth every 6 (six) hours as needed for nausea or vomiting.    . RABEprazole (ACIPHEX) 20 MG tablet Take 20 mg by mouth daily as needed (heartburn, acid reflux).     No facility-administered medications prior to visit.     ROS Review of Systems  Constitutional: Negative for activity change,  appetite change and fatigue.  HENT: Negative for congestion, sinus pressure and sore throat.   Eyes: Negative for visual disturbance.  Respiratory: Negative for cough, chest tightness, shortness of breath and wheezing.   Cardiovascular: Negative for chest pain and palpitations.  Gastrointestinal: Negative for abdominal distention, abdominal pain and constipation.  Endocrine: Negative for polydipsia.  Genitourinary: Negative for dysuria and frequency.  Musculoskeletal: Negative for arthralgias and back pain.  Skin: Negative for rash.  Neurological: Negative  for tremors, light-headedness and numbness.  Hematological: Does not bruise/bleed easily.  Psychiatric/Behavioral: Negative for agitation and behavioral problems.    Objective:  BP 127/65   Pulse 100   Temp 98 F (36.7 C) (Oral)   Wt 204 lb (92.5 kg)   SpO2 94%   BMI 41.20 kg/m   BP/Weight 08/22/2017 08/08/2017 07/28/2017  Systolic BP 127 113 127  Diastolic BP 65 82 64  Wt. (Lbs) 204 201.4 -  BMI 41.2 40.68 -      Physical Exam  Constitutional: She is oriented to person, place, and time. She appears well-developed and well-nourished.  Cardiovascular: Normal rate, normal heart sounds and intact distal pulses.  No murmur heard. Pulmonary/Chest: Effort normal and breath sounds normal. She has no wheezes. She has no rales. She exhibits no tenderness.  Abdominal: Soft. Bowel sounds are normal. She exhibits no distension and no mass. There is no tenderness.  Musculoskeletal: Normal range of motion. She exhibits edema (L leg).  Neurological: She is alert and oriented to person, place, and time.  Skin:  Left leg is slightly edematous and warm with evidence of resolving edema.  Skin changes from resolving cellulitis but no areas of erythema, no desquamation as seen at last office visit     CMP Latest Ref Rng & Units 08/08/2017 07/27/2017 07/26/2017  Glucose 65 - 99 mg/dL 960(A) 540(J) 811(B)  BUN 6 - 24 mg/dL 16 16 17   Creatinine 0.57 - 1.00 mg/dL 1.47(W) 2.95(A) 2.13(Y)  Sodium 134 - 144 mmol/L 132(L) 134(L) 133(L)  Potassium 3.5 - 5.2 mmol/L 4.3 4.9 4.9  Chloride 96 - 106 mmol/L 96 103 97(L)  CO2 20 - 29 mmol/L 20 23 22   Calcium 8.7 - 10.2 mg/dL 9.1 8.6(V) 7.8(I)  Total Protein 6.0 - 8.5 g/dL 7.5 - -  Total Bilirubin 0.0 - 1.2 mg/dL 0.4 - -  Alkaline Phos 39 - 117 IU/L 164(H) - -  AST 0 - 40 IU/L 17 - -  ALT 0 - 32 IU/L 27 - -    CBC    Component Value Date/Time   WBC 8.7 08/08/2017 1104   WBC 22.2 (H) 07/27/2017 0406   RBC 3.84 08/08/2017 1104   RBC 3.85 (L) 07/27/2017  0406   HGB 10.6 (L) 08/08/2017 1104   HCT 32.3 (L) 08/08/2017 1104   PLT 336 08/08/2017 1104   MCV 84 08/08/2017 1104   MCH 27.6 08/08/2017 1104   MCH 27.5 07/27/2017 0406   MCHC 32.8 08/08/2017 1104   MCHC 32.0 07/27/2017 0406   RDW 14.3 08/08/2017 1104   LYMPHSABS 2.5 08/08/2017 1104   MONOABS 2.2 (H) 07/26/2017 0628   EOSABS 0.6 (H) 08/08/2017 1104   BASOSABS 0.1 08/08/2017 1104    Assessment & Plan:   1. Cellulitis of left lower extremity Improving Completed two rounds of Keflex Provided letter for the airlines to allow elevation of lower extremity - CBC with Differential/Platelet   No orders of the defined types were placed in this encounter.   Follow-up: Return if  symptoms worsen or fail to improve.   Hoy RegisterEnobong Diondra Pines MD

## 2017-08-22 NOTE — Patient Instructions (Signed)
Edema Edema is when you have too much fluid in your body or under your skin. Edema may make your legs, feet, and ankles swell up. Swelling is also common in looser tissues, like around your eyes. This is a common condition. It gets more common as you get older. There are many possible causes of edema. Eating too much salt (sodium) and being on your feet or sitting for a long time can cause edema in your legs, feet, and ankles. Hot weather may make edema worse. Edema is usually painless. Your skin may look swollen or shiny. Follow these instructions at home:  Keep the swollen body part raised (elevated) above the level of your heart when you are sitting or lying down.  Do not sit still or stand for a long time.  Do not wear tight clothes. Do not wear garters on your upper legs.  Exercise your legs. This can help the swelling go down.  Wear elastic bandages or support stockings as told by your doctor.  Eat a low-salt (low-sodium) diet to reduce fluid as told by your doctor.  Depending on the cause of your swelling, you may need to limit how much fluid you drink (fluid restriction).  Take over-the-counter and prescription medicines only as told by your doctor. Contact a doctor if:  Treatment is not working.  You have heart, liver, or kidney disease and have symptoms of edema.  You have sudden and unexplained weight gain. Get help right away if:  You have shortness of breath or chest pain.  You cannot breathe when you lie down.  You have pain, redness, or warmth in the swollen areas.  You have heart, liver, or kidney disease and get edema all of a sudden.  You have a fever and your symptoms get worse all of a sudden. Summary  Edema is when you have too much fluid in your body or under your skin.  Edema may make your legs, feet, and ankles swell up. Swelling is also common in looser tissues, like around your eyes.  Raise (elevate) the swollen body part above the level of your  heart when you are sitting or lying down.  Follow your doctor's instructions about diet and how much fluid you can drink (fluid restriction). This information is not intended to replace advice given to you by your health care provider. Make sure you discuss any questions you have with your health care provider. Document Released: 07/06/2007 Document Revised: 02/05/2016 Document Reviewed: 02/05/2016 Elsevier Interactive Patient Education  2017 [employer]  

## 2017-08-23 LAB — CBC WITH DIFFERENTIAL/PLATELET
Basophils Absolute: 0 10*3/uL (ref 0.0–0.2)
Basos: 1 %
EOS (ABSOLUTE): 0.5 10*3/uL — ABNORMAL HIGH (ref 0.0–0.4)
EOS: 8 %
HEMATOCRIT: 28.9 % — AB (ref 34.0–46.6)
Hemoglobin: 9.5 g/dL — ABNORMAL LOW (ref 11.1–15.9)
IMMATURE GRANS (ABS): 0 10*3/uL (ref 0.0–0.1)
Immature Granulocytes: 0 %
LYMPHS: 27 %
Lymphocytes Absolute: 1.7 10*3/uL (ref 0.7–3.1)
MCH: 28.1 pg (ref 26.6–33.0)
MCHC: 32.9 g/dL (ref 31.5–35.7)
MCV: 86 fL (ref 79–97)
MONOCYTES: 9 %
Monocytes Absolute: 0.6 10*3/uL (ref 0.1–0.9)
NEUTROS ABS: 3.6 10*3/uL (ref 1.4–7.0)
Neutrophils: 55 %
Platelets: 323 10*3/uL (ref 150–450)
RBC: 3.38 x10E6/uL — AB (ref 3.77–5.28)
RDW: 14 % (ref 12.3–15.4)
WBC: 6.3 10*3/uL (ref 3.4–10.8)

## 2017-08-25 ENCOUNTER — Telehealth: Payer: Self-pay

## 2017-08-25 NOTE — Telephone Encounter (Signed)
Patient was called and informed of lab results. 

## 2019-11-18 ENCOUNTER — Ambulatory Visit (HOSPITAL_COMMUNITY)
Admission: EM | Admit: 2019-11-18 | Discharge: 2019-11-18 | Disposition: A | Discharge status: Home / Self Care | Payer: PRIVATE HEALTH INSURANCE | Attending: Family Medicine | Admitting: Family Medicine

## 2019-11-18 ENCOUNTER — Other Ambulatory Visit: Payer: Self-pay

## 2019-11-18 ENCOUNTER — Encounter (HOSPITAL_COMMUNITY): Payer: Self-pay | Admitting: Family Medicine

## 2019-11-18 DIAGNOSIS — M5431 Sciatica, right side: Secondary | ICD-10-CM

## 2019-11-18 MED ORDER — PREDNISONE 10 MG (21) PO TBPK: ORAL_TABLET | ORAL | 0 refills | Status: DC

## 2019-11-18 MED ORDER — TIZANIDINE HCL 4 MG PO TABS: 4.0000 mg | ORAL_TABLET | Freq: Four times a day (QID) | ORAL | 0 refills | Status: DC | PRN

## 2019-11-18 NOTE — Discharge Instructions (Addendum)
Treating you for sciatic nerve pain Taker the medicine as prescribed Heat to the back, stretch I have printed some information out on stretches to do.  Follow up as needed for continued or worsening symptoms

## 2019-11-18 NOTE — ED Triage Notes (Signed)
Pt presents with right-sided lower back pain and  right leg pain x 3 days. States is painful when walking and when standing. Denies fall, heavy lifting, new exercises.

## 2019-11-18 NOTE — ED Provider Notes (Signed)
[facility]    CSN: [id number] Arrival date & time: 11/18/19  5852      History   Chief Complaint Chief Complaint  Patient presents with  . Leg Pain    HPI Aimee Wilson is a 59 y.o. female.   Patient is a 59 year old female who presents today with right lower back pain with radiation down the right leg, associated tingling.  This is been constant, waxing waning over the past 3 days.  Pain with walking, laying flat and standing.  Denies any falls, injuries, heavy lifting or exercising prior to this starting.  Has been taking medication from [place] for pain but does not seem to be helping.  No loss of bowel or bladder function.     Past Medical History:  Diagnosis Date  . Hypertension     Patient Active Problem List   Diagnosis Date Noted  . Sepsis (HCC) 07/21/2017  . AKI (acute kidney injury) ([facility]) 07/21/2017  . Cellulitis 07/21/2017  . Acute metabolic encephalopathy 07/21/2017  . Hypertension     History reviewed. No pertinent surgical history.  OB History   No obstetric history on file.      Home Medications    Prior to Admission medications   Medication Sig Start Date End Date Taking? Authorizing Provider  amitriptyline (ELAVIL) 10 MG tablet Take 10 mg by mouth as needed for sleep.    [provider]  predniSONE (STERAPRED UNI-PAK 21 TAB) 10 MG (21) TBPK tablet 6 tabs for 1 day, then 5 tabs for 1 das, then 4 tabs for 1 day, then 3 tabs for 1 day, 2 tabs for 1 day, then 1 tab for 1 day 11/18/19   Dahlia Byes A, NP  pregabalin (LYRICA) 50 MG capsule Take 50 mg by mouth as needed (pain).    [provider]  PRESCRIPTION MEDICATION Take 1 tablet by mouth daily. Combination drug: Torsemide-Spironolactone 5mg -50mg     [provider]  promethazine (PHENERGAN) 25 MG tablet Take 25 mg by mouth every 6 (six) hours as needed for nausea or vomiting.    [provider]  RABEprazole (ACIPHEX) 20 MG tablet Take 20 mg by mouth  daily as needed (heartburn, acid reflux).    [provider]  rosuvastatin (CRESTOR) 10 MG tablet Take 10 mg by mouth daily.    [provider]  tiZANidine (ZANAFLEX) 4 MG tablet Take 1 tablet (4 mg total) by mouth every 6 (six) hours as needed for muscle spasms. 11/18/19   , NP    Family History History reviewed. No pertinent family history.  Social History Social History   Tobacco Use  . Smoking status: Never Smoker  . Smokeless tobacco: Never Used  Substance Use Topics  . Alcohol use: Not on file  . Drug use: Not on file     Allergies   Beef-derived products and Zosyn [piperacillin sod-tazobactam so]   Review of Systems Review of Systems   Physical Exam Triage Vital Signs ED Triage Vitals  Enc Vitals Group     BP 11/18/19 0849 139/89     Pulse Rate 11/18/19 0849 73     Resp 11/18/19 0849 20     Temp 11/18/19 0849 98.2 F (36.8 C)     Temp Source 11/18/19 0849 Oral     SpO2 11/18/19 0849 97 %     Weight --      Height --      Head Circumference --  Peak Flow --      Pain Score 11/18/19 0846 8     Pain Loc --      Pain Edu? --      Excl. in GC? --    No data found.  Updated Vital Signs BP 139/89 (BP Location: Left Arm)   Pulse 73   Temp 98.2 F (36.8 C) (Oral)   Resp 20   SpO2 97%   Visual Acuity Right Eye Distance:   Left Eye Distance:   Bilateral Distance:    Right Eye Near:   Left Eye Near:    Bilateral Near:     Physical Exam Vitals and nursing note reviewed.  Constitutional:      General: She is not in acute distress.    Appearance: Normal appearance. She is not ill-appearing, toxic-appearing or diaphoretic.  HENT:     Head: Normocephalic.     Nose: Nose normal.  Eyes:     Conjunctiva/sclera: Conjunctivae normal.  Pulmonary:     Effort: Pulmonary effort is normal.  Musculoskeletal:     Cervical back: Normal range of motion.     Lumbar back: Positive right straight leg raise test.       Back:      Comments: Non tender to palpation.  Area highlighted where pt sts her pain is.   Skin:    General: Skin is warm and dry.     Findings: No rash.  Neurological:     Mental Status: She is alert.  Psychiatric:        Mood and Affect: Mood normal.      UC Treatments / Results  Labs (all labs ordered are listed, but only abnormal results are displayed) Labs Reviewed - No data to display  EKG   Radiology No results found.  Procedures Procedures (including critical care time)  Medications Ordered in UC Medications - No data to display  Initial Impression / Assessment and Plan / UC Course  I have reviewed the triage vital signs and the nursing notes.  Pertinent labs & imaging results that were available during my care of the patient were reviewed by me and considered in my medical decision making (see chart for details).     Sciatic nerve pain, right. Treating with prednisone taper Zanaflex  As needed  Stretching, alternate heat/ice  Follow up as needed for continued or worsening symptoms  Final Clinical Impressions(s) / UC Diagnoses   Final diagnoses:  Sciatica of right side     Discharge Instructions     Treating you for sciatic nerve pain Taker the medicine as prescribed Heat to the back, stretch I have printed some information out on stretches to do.  Follow up as needed for continued or worsening symptoms       ED Prescriptions    Medication Sig Dispense Auth. Provider   predniSONE (STERAPRED UNI-PAK 21 TAB) 10 MG (21) TBPK tablet 6 tabs for 1 day, then 5 tabs for 1 das, then 4 tabs for 1 day, then 3 tabs for 1 day, 2 tabs for 1 day, then 1 tab for 1 day 21 tablet Ezana Hubbert A, NP   tiZANidine (ZANAFLEX) 4 MG tablet Take 1 tablet (4 mg total) by mouth every 6 (six) hours as needed for muscle spasms. 30 tablet Dahlia Byes A, NP     PDMP not reviewed this encounter.   Janace Aris, NP 11/18/19 [phone]

## 2019-12-10 ENCOUNTER — Encounter (HOSPITAL_COMMUNITY): Payer: Self-pay | Admitting: Emergency Medicine

## 2019-12-10 ENCOUNTER — Other Ambulatory Visit: Payer: Self-pay

## 2019-12-10 ENCOUNTER — Ambulatory Visit (HOSPITAL_COMMUNITY)
Admission: EM | Admit: 2019-12-10 | Discharge: 2019-12-10 | Disposition: A | Discharge status: Home / Self Care | Payer: PRIVATE HEALTH INSURANCE | Attending: Physician Assistant | Admitting: Physician Assistant

## 2019-12-10 DIAGNOSIS — M5432 Sciatica, left side: Secondary | ICD-10-CM

## 2019-12-10 MED ORDER — PREDNISONE 10 MG (21) PO TBPK: ORAL_TABLET | ORAL | 0 refills | Status: DC

## 2019-12-10 MED ORDER — TIZANIDINE HCL 4 MG PO TABS: 4.0000 mg | ORAL_TABLET | Freq: Four times a day (QID) | ORAL | 0 refills | Status: DC | PRN

## 2019-12-10 NOTE — Discharge Instructions (Signed)
Return if any problems.

## 2019-12-10 NOTE — ED Provider Notes (Signed)
[facility]    CSN: [id number] Arrival date & time: 12/10/19  1607      History   Chief Complaint Chief Complaint  Patient presents with  . Leg Pain    HPI Aimee Wilson is a 59 y.o. female.   The history is provided by the patient. No language interpreter was used.  Leg Pain Location:  Leg Time since incident:  1 month Injury: no   Leg location:  L leg Pain details:    Quality:  Aching   Radiates to:  Does not radiate   Severity:  Moderate   Onset quality:  Gradual   Timing:  Constant   Progression:  Worsening Chronicity:  New Relieved by:  Nothing Worsened by:  Nothing Ineffective treatments:  None tried Associated symptoms: back pain   Associated symptoms: no numbness    Pt complains of back pain that goes down her left leg.  Past Medical History:  Diagnosis Date  . Hypertension     Patient Active Problem List   Diagnosis Date Noted  . Sepsis (HCC) 07/21/2017  . AKI (acute kidney injury) (HCC) 07/21/2017  . Cellulitis 07/21/2017  . Acute metabolic encephalopathy 07/21/2017  . Hypertension     History reviewed. No pertinent surgical history.  OB History   No obstetric history on file.      Home Medications    Prior to Admission medications   Medication Sig Start Date End Date Taking? Authorizing Provider  amitriptyline (ELAVIL) 10 MG tablet Take 10 mg by mouth as needed for sleep.    [provider]  predniSONE (STERAPRED UNI-PAK 21 TAB) 10 MG (21) TBPK tablet 6 tabs for 1 day, then 5 tabs for 1 das, then 4 tabs for 1 day, then 3 tabs for 1 day, 2 tabs for 1 day, then 1 tab for 1 day 12/10/19   Cheron Schaumann K, PA-C  pregabalin (LYRICA) 50 MG capsule Take 50 mg by mouth as needed (pain).    [provider]  PRESCRIPTION MEDICATION Take 1 tablet by mouth daily. Combination drug: Torsemide-Spironolactone 5mg -50mg     [provider]  promethazine (PHENERGAN) 25 MG tablet Take 25 mg by mouth every 6 (six) hours as  needed for nausea or vomiting.    [provider]  RABEprazole (ACIPHEX) 20 MG tablet Take 20 mg by mouth daily as needed (heartburn, acid reflux).    [provider]  rosuvastatin (CRESTOR) 10 MG tablet Take 10 mg by mouth daily.    [provider]  tiZANidine (ZANAFLEX) 4 MG tablet Take 1 tablet (4 mg total) by mouth every 6 (six) hours as needed for muscle spasms. 12/10/19   , PA-C    Family History History reviewed. No pertinent family history.  Social History Social History   Tobacco Use  . Smoking status: Never Smoker  . Smokeless tobacco: Never Used  Substance Use Topics  . Alcohol use: Not on file  . Drug use: Not on file     Allergies   Beef-derived products and Zosyn [piperacillin sod-tazobactam so]   Review of Systems Review of Systems  Musculoskeletal: Positive for back pain.  All other systems reviewed and are negative.    Physical Exam Triage Vital Signs ED Triage Vitals  Enc Vitals Group     BP 12/10/19 0820 (!) 94/58     Pulse Rate 12/10/19 0820 76     Resp 12/10/19 0820 17     Temp 12/10/19 0820 97.6 F (36.4  C)     Temp Source 12/10/19 0820 Oral     SpO2 12/10/19 0820 96 %     Weight --      Height --      Head Circumference --      Peak Flow --      Pain Score 12/10/19 0819 8     Pain Loc --      Pain Edu? --      Excl. in GC? --    No data found.  Updated Vital Signs BP (!) 94/58 (BP Location: Right Arm)   Pulse 76   Temp 97.6 F (36.4 C) (Oral)   Resp 17   SpO2 96%   Visual Acuity Right Eye Distance:   Left Eye Distance:   Bilateral Distance:    Right Eye Near:   Left Eye Near:    Bilateral Near:     Physical Exam Vitals and nursing note reviewed.  Constitutional:      Appearance: She is well-developed.  HENT:     Head: Normocephalic.  Cardiovascular:     Rate and Rhythm: Normal rate.  Pulmonary:     Effort: Pulmonary effort is normal.  Abdominal:     General: There is no  distension.  Musculoskeletal:        General: Normal range of motion.     Cervical back: Normal range of motion.     Comments: Tender sciatic area good range of motion    Neurological:     General: No focal deficit present.     Mental Status: She is alert and oriented to person, place, and time.  Psychiatric:        Mood and Affect: Mood normal.      UC Treatments / Results  Labs (all labs ordered are listed, but only abnormal results are displayed) Labs Reviewed - No data to display  EKG   Radiology No results found.  Procedures Procedures (including critical care time)  Medications Ordered in UC Medications - No data to display  Initial Impression / Assessment and Plan / UC Course  I have reviewed the triage vital signs and the nursing notes.  Pertinent labs & imaging results that were available during my care of the patient were reviewed by me and considered in my medical decision making (see chart for details).     MDM:  Pt had relief with previous treatment.  I will repeat.  Pt is visiting here.  Pt advised to follow up with her MD or return here if symptoms persist Final Clinical Impressions(s) / UC Diagnoses   Final diagnoses:  Sciatica of left side     Discharge Instructions     Return if any problems.    ED Prescriptions    Medication Sig Dispense Auth. Provider   predniSONE (STERAPRED UNI-PAK 21 TAB) 10 MG (21) TBPK tablet 6 tabs for 1 day, then 5 tabs for 1 das, then 4 tabs for 1 day, then 3 tabs for 1 day, 2 tabs for 1 day, then 1 tab for 1 day 21 tablet Baker Kogler K, PA-C   tiZANidine (ZANAFLEX) 4 MG tablet Take 1 tablet (4 mg total) by mouth every 6 (six) hours as needed for muscle spasms. 30 tablet Elson Areas, [place]     PDMP not reviewed this encounter.  An After Visit Summary was printed and given to the patient.   Elson Areas, [place] 12/10/19 [phone]

## 2019-12-10 NOTE — ED Triage Notes (Signed)
Pt presents with low back pain that radiates into right leg. States was seen on 10/18 and given muscle relaxer and pain medication with relief. States pain started again 3 days ago and now is having trouble walking.

## 2020-03-03 ENCOUNTER — Other Ambulatory Visit: Payer: Self-pay

## 2020-03-03 ENCOUNTER — Encounter (HOSPITAL_COMMUNITY): Payer: Self-pay

## 2020-03-03 ENCOUNTER — Ambulatory Visit (HOSPITAL_COMMUNITY)
Admission: EM | Admit: 2020-03-03 | Discharge: 2020-03-03 | Disposition: A | Discharge status: Home / Self Care | Payer: 59 | Attending: Student | Admitting: Student

## 2020-03-03 DIAGNOSIS — I10 Essential (primary) hypertension: Secondary | ICD-10-CM | POA: Diagnosis not present

## 2020-03-03 DIAGNOSIS — Z7184 Encounter for health counseling related to travel: Secondary | ICD-10-CM | POA: Diagnosis not present

## 2020-03-03 DIAGNOSIS — M5431 Sciatica, right side: Secondary | ICD-10-CM | POA: Diagnosis not present

## 2020-03-03 MED ORDER — PREDNISONE 10 MG (21) PO TBPK: ORAL_TABLET | Freq: Every day | ORAL | 0 refills | Status: AC

## 2020-03-03 MED ORDER — TIZANIDINE HCL 4 MG PO CAPS: 4.0000 mg | ORAL_CAPSULE | Freq: Three times a day (TID) | ORAL | 0 refills | Status: AC | PRN

## 2020-03-03 MED ORDER — TIZANIDINE HCL 2 MG PO CAPS: 6.0000 mg | ORAL_CAPSULE | Freq: Three times a day (TID) | ORAL | 0 refills | Status: DC | PRN

## 2020-03-03 NOTE — ED Provider Notes (Signed)
[facility]    CSN: [id number] Arrival date & time: 03/03/20  1835      History   Chief Complaint Chief Complaint  Patient presents with  . Leg Pain    HPI Aimee Wilson is a 60 y.o. female presenting for sciatica, medication counseling, and travel counseling. History sepsis, AKI, hypertension, sciatica. She is here today with her son who provides translation. presenting today for 2 days of right-sided sciatica without lower back pain. Patient has experienced this sciatica multiple times in the past and has been prescribed prednisone and zanaflex each time with resolution of symptoms. She was also prescribed exercises that have helped in the past, though she recently stopped doing them. She will be travelling outside the country 03/07/2020 and also has questions about what will make her journey more comfortable. Pt brought her medication bottles today and I confirmed that she is taking beta blocker, rosuvastatin, and water pill as directed by her physician in [place]. Denies pain shooting down legs, denies numbness in arms/legs, denies weakness in arms/legs, denies saddle anesthesia, denies bowel/bladder incontinence.    HPI  Past Medical History:  Diagnosis Date  . Hypertension     Patient Active Problem List   Diagnosis Date Noted  . Sepsis (HCC) 07/21/2017  . AKI (acute kidney injury) (HCC) 07/21/2017  . Cellulitis 07/21/2017  . Acute metabolic encephalopathy 07/21/2017  . Hypertension     History reviewed. No pertinent surgical history.  OB History   No obstetric history on file.      Home Medications    Prior to Admission medications   Medication Sig Start Date End Date Taking? Authorizing Provider  predniSONE (STERAPRED UNI-PAK 21 TAB) 10 MG (21) TBPK tablet Take by mouth daily. Take 6 tabs by mouth daily  for 1 day, then 5 tabs for 1 day, then 4 tabs for 1 day, then 3 tabs for 1 day, 2 tabs for 1 day, then 1 tab by mouth daily for 1 day 03/03/20  Yes  Rhys Martini, PA-C  amitriptyline (ELAVIL) 10 MG tablet Take 10 mg by mouth as needed for sleep.    [provider]  pregabalin (LYRICA) 50 MG capsule Take 50 mg by mouth as needed (pain).    [provider]  PRESCRIPTION MEDICATION Take 1 tablet by mouth daily. Combination drug: Torsemide-Spironolactone 5mg -50mg     [provider]  promethazine (PHENERGAN) 25 MG tablet Take 25 mg by mouth every 6 (six) hours as needed for nausea or vomiting.    [provider]  RABEprazole (ACIPHEX) 20 MG tablet Take 20 mg by mouth daily as needed (heartburn, acid reflux).    [provider]  rosuvastatin (CRESTOR) 10 MG tablet Take 10 mg by mouth daily.    [provider]  tiZANidine (ZANAFLEX) 4 MG capsule Take 1 capsule (4 mg total) by mouth 3 (three) times daily as needed for muscle spasms. 03/03/20   , PA-C    Family History History reviewed. No pertinent family history.  Social History Social History   Tobacco Use  . Smoking status: Never Smoker  . Smokeless tobacco: Never Used     Allergies   Beef-derived products and Zosyn [piperacillin sod-tazobactam so]   Review of Systems Review of Systems  Constitutional: Negative for chills, fever and unexpected weight change.  Respiratory: Negative for chest tightness and shortness of breath.   Cardiovascular: Negative for chest pain and palpitations.  Gastrointestinal: Negative for abdominal pain, diarrhea, nausea and vomiting.  Genitourinary: Negative for decreased urine volume, difficulty urinating and frequency.  Musculoskeletal: Negative for arthralgias, back pain, gait problem, joint swelling, myalgias, neck pain and neck stiffness.       Sciatica  Skin: Negative for wound.  Neurological: Negative for dizziness, tremors, seizures, syncope, facial asymmetry, speech difficulty, weakness, light-headedness, numbness and headaches.     Physical Exam Triage Vital Signs ED  Triage Vitals  Enc Vitals Group     BP 03/03/20 1911 (!) 158/87     Pulse Rate 03/03/20 1911 77     Resp 03/03/20 1911 15     Temp 03/03/20 1911 98.3 F (36.8 C)     Temp Source 03/03/20 1911 Oral     SpO2 03/03/20 1911 98 %     Weight --      Height --      Head Circumference --      Peak Flow --      Pain Score 03/03/20 1910 8     Pain Loc --      Pain Edu? --      Excl. in GC? --    No data found.  Updated Vital Signs BP (!) 158/87 (BP Location: Left Arm)   Pulse 77   Temp 98.3 F (36.8 C) (Oral)   Resp 15   SpO2 98%   Visual Acuity Right Eye Distance:   Left Eye Distance:   Bilateral Distance:    Right Eye Near:   Left Eye Near:    Bilateral Near:     Physical Exam Vitals reviewed.  Constitutional:      General: She is not in acute distress.    Appearance: Normal appearance. She is not ill-appearing.  HENT:     Head: Normocephalic and atraumatic.  Cardiovascular:     Rate and Rhythm: Normal rate and regular rhythm.     Heart sounds: Normal heart sounds.  Pulmonary:     Effort: Pulmonary effort is normal.     Breath sounds: Normal breath sounds and air entry.  Abdominal:     Tenderness: There is no abdominal tenderness. There is no right CVA tenderness, left CVA tenderness, guarding or rebound.     Comments: No bowel or bladder incontinence.  Musculoskeletal:     Cervical back: Normal range of motion. No swelling, deformity, signs of trauma, rigidity, spasms, tenderness, bony tenderness or crepitus. No pain with movement.     Thoracic back: No swelling, deformity, signs of trauma, spasms, tenderness or bony tenderness. Normal range of motion. No scoliosis.     Lumbar back: No swelling, deformity, signs of trauma, spasms, tenderness or bony tenderness. Normal range of motion. Positive right straight leg raise test. Negative left straight leg raise test. No scoliosis.     Comments: Strength 5/5 in UEs and LEs. Negative homan sign bilaterally.    Neurological:     General: No focal deficit present.     Mental Status: She is alert.     Cranial Nerves: No cranial nerve deficit.     Comments: Strength 5/5 in UEs and LEs. Gait normal. Sensation intact in UEs and LEs.   Psychiatric:        Mood and Affect: Mood normal.        Behavior: Behavior normal.        Thought Content: Thought content normal.        Judgment: Judgment normal.      UC Treatments / Results  Labs (all labs ordered are listed, but only  abnormal results are displayed) Labs Reviewed - No data to display  EKG   Radiology No results found.  Procedures Procedures (including critical care time)  Medications Ordered in UC Medications - No data to display  Initial Impression / Assessment and Plan / UC Course  I have reviewed the triage vital signs and the nursing notes.  Pertinent labs & imaging results that were available during my care of the patient were reviewed by me and considered in my medical decision making (see chart for details).     For sciatica, prednisone and zanaflex as below. She is not a diabetic. Restart back exercises.  Continue BP medications as directed. Pt is followed by PCP in [place] where she is from. I asked her to monitor BP at home and discuss readings with PCP.  Counseled patient on travel precautions to prevent DVT and back strain.   Translation provided by son. Spent over 40 minutes obtaining H&P, performing physical, discussing results, treatment plan and plan for follow-up with patient. Patient agrees with plan.     Final Clinical Impressions(s) / UC Diagnoses   Final diagnoses:  Sciatica of right side  Essential hypertension  Counseling about travel     Discharge Instructions     -Prednisone as directed -Zanaflex as needed, up to 3x daily for back pain  -On your flight, make sure to get up every 2-3 hours and walk or stand for several minutes.    ED Prescriptions    Medication Sig Dispense Auth.  Provider   predniSONE (STERAPRED UNI-PAK 21 TAB) 10 MG (21) TBPK tablet Take by mouth daily. Take 6 tabs by mouth daily  for 1 day, then 5 tabs for 1 day, then 4 tabs for 1 day, then 3 tabs for 1 day, 2 tabs for 1 day, then 1 tab by mouth daily for 1 day 21 tablet Rhys Martini, PA-C   tizanidine (ZANAFLEX) 2 MG capsule  (Status: Discontinued) Take 3 capsules (6 mg total) by mouth 3 (three) times daily as needed for muscle spasms. 45 capsule Ignacia Bayley E, PA-C   tiZANidine (ZANAFLEX) 4 MG capsule Take 1 capsule (4 mg total) by mouth 3 (three) times daily as needed for muscle spasms. 45 capsule Rhys Martini, PA-C     PDMP not reviewed this encounter.   Rhys Martini, PA-C 03/03/20 1945

## 2020-03-03 NOTE — ED Triage Notes (Signed)
Pt presents with leg pain 2 weeks. Pt husband states she has had leg pain 4 months ago. He states she was prescribed medication that helped. The husband states the pt was doing exercise that relieved the leg pain, states the pts father passed away and she stopped exercising. He states the past couple days, her leg pain has gotten worse. Pt husband reports her traveling on 01/05 out of the country and having to be on a plane for 48 hours.

## 2020-03-03 NOTE — Discharge Instructions (Addendum)
-  Prednisone as directed -Zanaflex as needed, up to 3x daily for back pain  -On your flight, make sure to get up every 2-3 hours and walk or stand for several minutes.

## 2020-03-15 IMAGING — MR MR [PERSON_NAME] LOW W/O CM*L*
4 of 7 series · 19 of 40 positions shown · non-contrast
Comparison: Radiograph 07/21/2017

CLINICAL DATA: Left lower extremity pain and swelling.

EXAM:
MRI OF LOWER LEFT EXTREMITY WITHOUT CONTRAST
TECHNIQUE: Multiplanar, multisequence MR imaging of the left lower extremity
was performed. No intravenous contrast was administered.

[Series 3: T1 · axial · 5.0mm · 0.80mm/px · z∈[-219,+80]mm · 5 of 55 slices shown (1 of 3)]
[im 1/55]
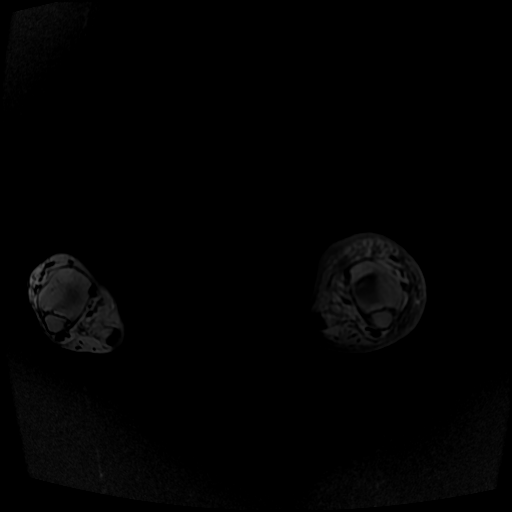
[im 8/55]
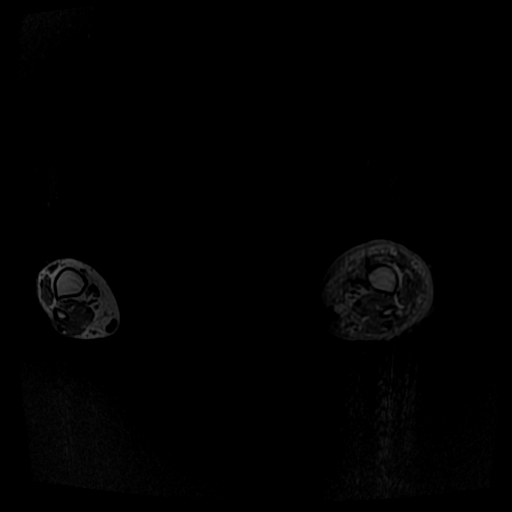
[im 16/55]
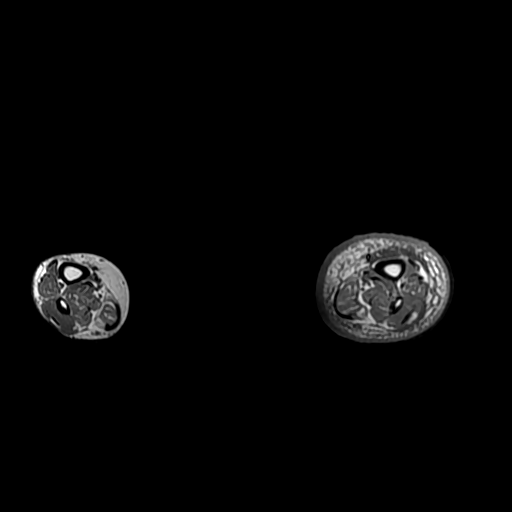
[im 31/55]
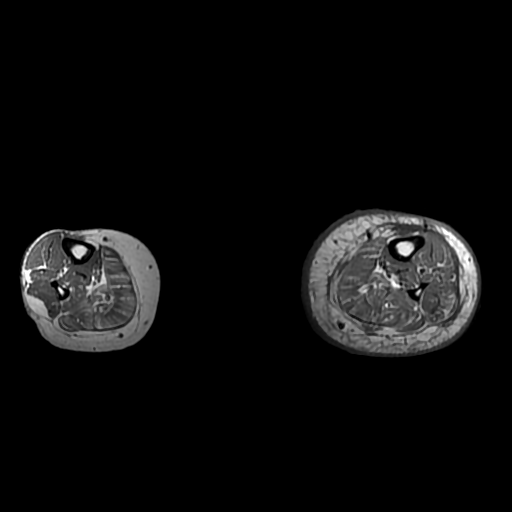
[im 47/55]
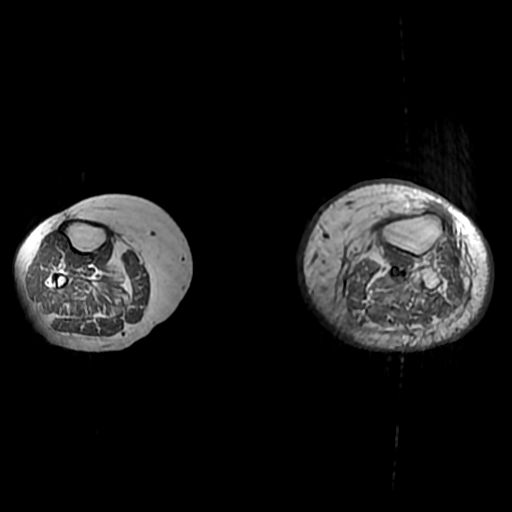

[Series 4: T2 fat-sat · axial · 5.0mm · 0.80mm/px · z∈[-219,+132]mm · 8 of 55 slices shown]
[im 1/55]
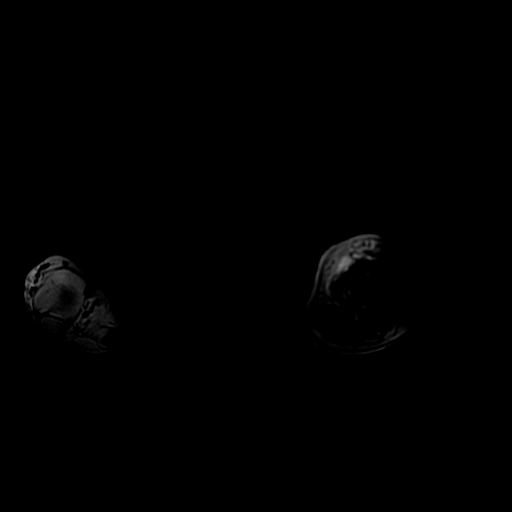
[im 8/55]
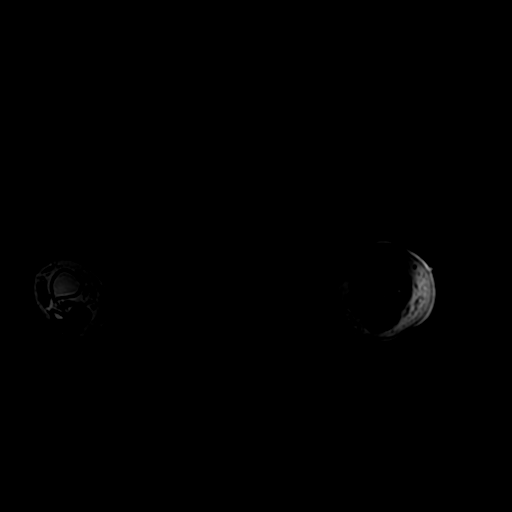
[im 16/55]
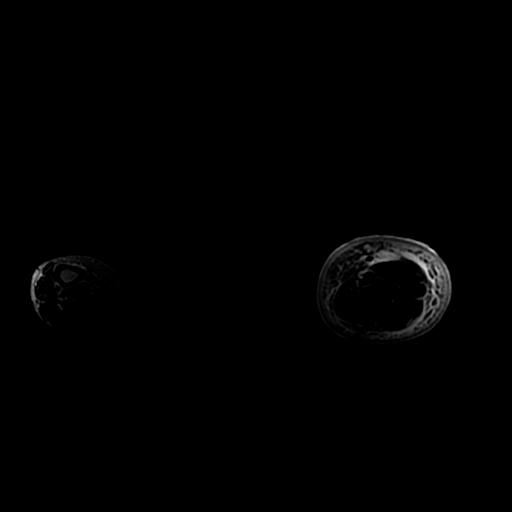
[im 24/55]
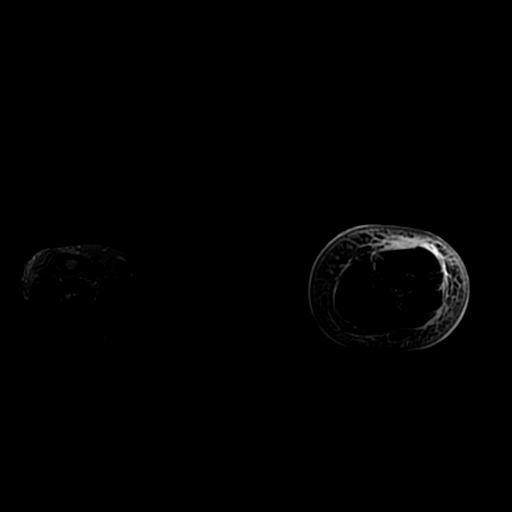
[im 31/55]
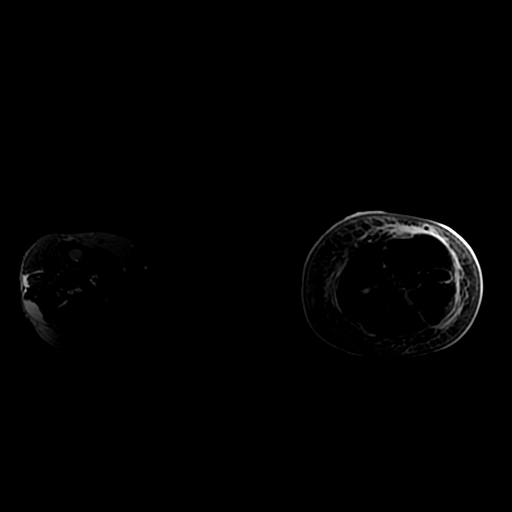
[im 39/55]
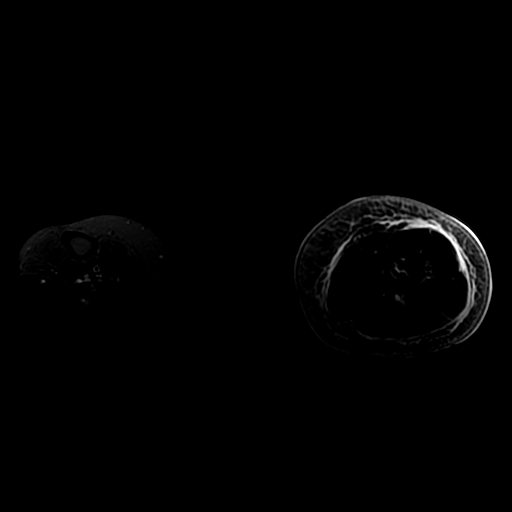
[im 47/55]
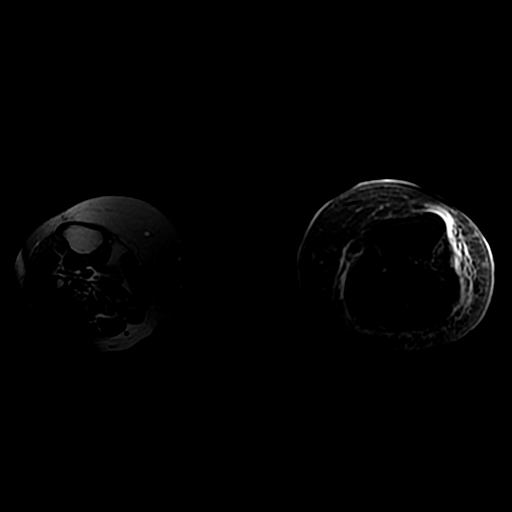
[im 55/55]
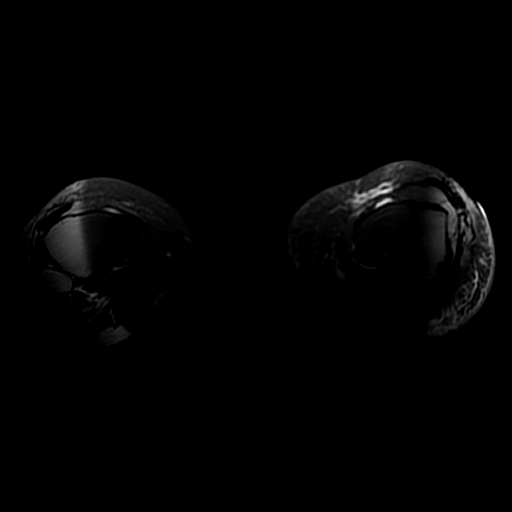

[Series 6: T1 · coronal · 5.0mm · 0.80mm/px · 3 of 26 slices shown (2 of 3)]
[im 1/26]
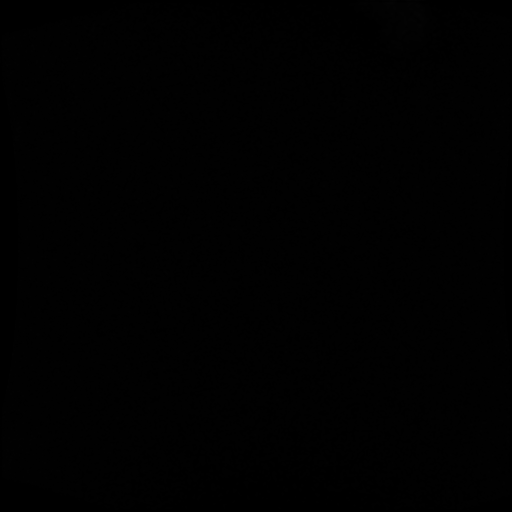
[im 17/26]
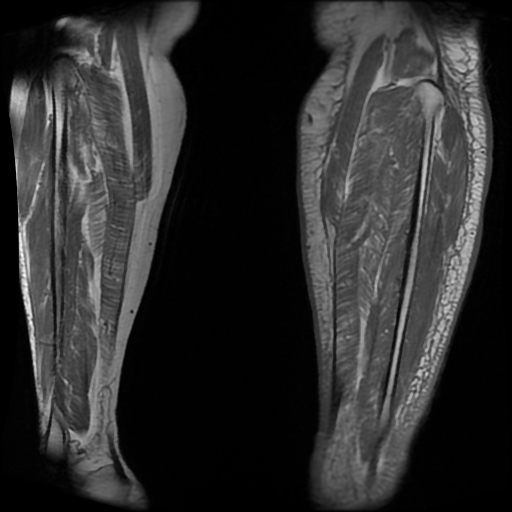
[im 26/26]
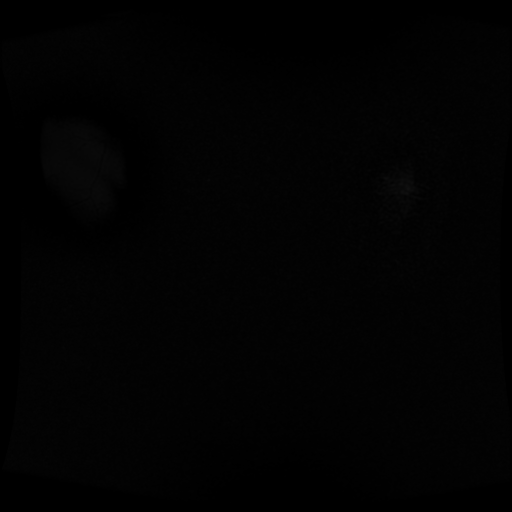

[Series 7: T1 · sagittal · 5.0mm · 0.78mm/px · 3 of 26 slices shown (3 of 3)]
[im 1/26]
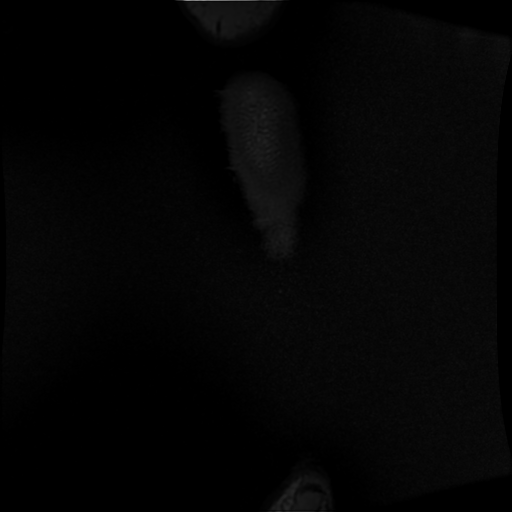
[im 17/26]
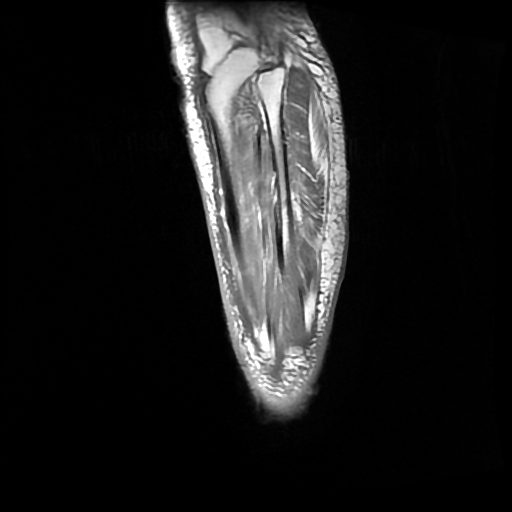
[im 26/26]
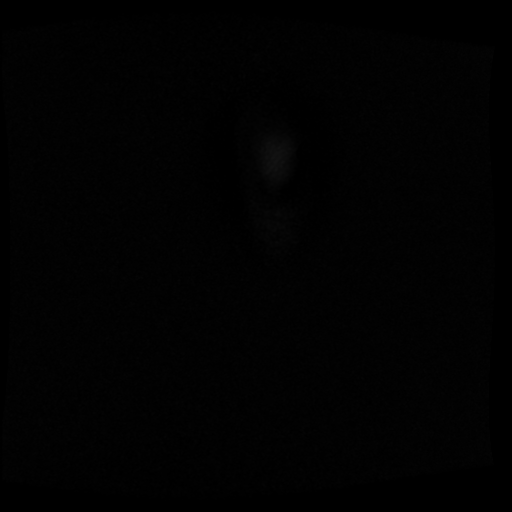

[19 of 40 positions shown; findings below may reference images not displayed]

FINDINGS: Extensive subcutaneous soft tissue swelling/edema/fluid and skin
thickening and edema most consistent with diffuse cellulitis. There
is more focal fluid like signal changes surrounding the deep fascia
but no findings to suggest myofasciitis or pyomyositis.

No discrete fluid collection to suggest a drainable soft tissue
abscess.

No MR findings to suggest septic arthritis or osteomyelitis.
IMPRESSION: 1. Diffuse severe left lower extremity cellulitis without discrete
drainable soft tissue abscess.
2. No findings for myofasciitis, pyomyositis, septic arthritis or
osteomyelitis.
